# Patient Record
Sex: Female | Born: 1945 | Race: Black or African American | Hispanic: No | Marital: Single | Smoking: Never smoker
Health system: Southern US, Community
[De-identification: ages and names within clinical notes are randomized; demographics above are authoritative.]

## PROBLEM LIST (undated history)

## (undated) DIAGNOSIS — Z803 Family history of malignant neoplasm of breast: Secondary | ICD-10-CM

## (undated) DIAGNOSIS — G709 Myoneural disorder, unspecified: Secondary | ICD-10-CM

## (undated) DIAGNOSIS — Z8052 Family history of malignant neoplasm of bladder: Secondary | ICD-10-CM

## (undated) DIAGNOSIS — E119 Type 2 diabetes mellitus without complications: Secondary | ICD-10-CM

## (undated) DIAGNOSIS — Z9221 Personal history of antineoplastic chemotherapy: Secondary | ICD-10-CM

## (undated) DIAGNOSIS — Z923 Personal history of irradiation: Secondary | ICD-10-CM

## (undated) DIAGNOSIS — C50919 Malignant neoplasm of unspecified site of unspecified female breast: Secondary | ICD-10-CM

## (undated) DIAGNOSIS — Z853 Personal history of malignant neoplasm of breast: Secondary | ICD-10-CM

## (undated) DIAGNOSIS — N189 Chronic kidney disease, unspecified: Secondary | ICD-10-CM

## (undated) DIAGNOSIS — D649 Anemia, unspecified: Secondary | ICD-10-CM

## (undated) DIAGNOSIS — I10 Essential (primary) hypertension: Secondary | ICD-10-CM

## (undated) HISTORY — DX: Myoneural disorder, unspecified: G70.9

## (undated) HISTORY — DX: Personal history of malignant neoplasm of breast: Z85.3

## (undated) HISTORY — DX: Anemia, unspecified: D64.9

## (undated) HISTORY — DX: Family history of malignant neoplasm of breast: Z80.3

## (undated) HISTORY — DX: Malignant neoplasm of unspecified site of unspecified female breast: C50.919

## (undated) HISTORY — DX: Family history of malignant neoplasm of bladder: Z80.52

## (undated) HISTORY — PX: ABDOMINAL HYSTERECTOMY: SHX81

## (undated) HISTORY — DX: Chronic kidney disease, unspecified: N18.9

## (undated) HISTORY — DX: Essential (primary) hypertension: I10

## (undated) HISTORY — DX: Type 2 diabetes mellitus without complications: E11.9

---

## 2004-06-23 HISTORY — PX: BREAST LUMPECTOMY: SHX2

## 2018-08-23 HISTORY — PX: BREAST LUMPECTOMY: SHX2

## 2020-09-26 DIAGNOSIS — Z8739 Personal history of other diseases of the musculoskeletal system and connective tissue: Secondary | ICD-10-CM | POA: Insufficient documentation

## 2020-10-07 ENCOUNTER — Ambulatory Visit: Payer: Medicare HMO | Admitting: Podiatry

## 2020-10-07 ENCOUNTER — Other Ambulatory Visit: Payer: Self-pay

## 2020-10-07 DIAGNOSIS — E119 Type 2 diabetes mellitus without complications: Secondary | ICD-10-CM

## 2020-10-07 DIAGNOSIS — M79675 Pain in left toe(s): Secondary | ICD-10-CM

## 2020-10-07 DIAGNOSIS — R251 Tremor, unspecified: Secondary | ICD-10-CM | POA: Insufficient documentation

## 2020-10-07 DIAGNOSIS — B351 Tinea unguium: Secondary | ICD-10-CM

## 2020-10-07 DIAGNOSIS — F32A Depression, unspecified: Secondary | ICD-10-CM | POA: Insufficient documentation

## 2020-10-07 DIAGNOSIS — I10 Essential (primary) hypertension: Secondary | ICD-10-CM | POA: Insufficient documentation

## 2020-10-07 DIAGNOSIS — M79674 Pain in right toe(s): Secondary | ICD-10-CM | POA: Diagnosis not present

## 2020-10-07 DIAGNOSIS — E7800 Pure hypercholesterolemia, unspecified: Secondary | ICD-10-CM | POA: Insufficient documentation

## 2020-10-07 DIAGNOSIS — Z170 Estrogen receptor positive status [ER+]: Secondary | ICD-10-CM | POA: Insufficient documentation

## 2020-10-07 DIAGNOSIS — C50911 Malignant neoplasm of unspecified site of right female breast: Secondary | ICD-10-CM | POA: Insufficient documentation

## 2020-10-07 DIAGNOSIS — C50919 Malignant neoplasm of unspecified site of unspecified female breast: Secondary | ICD-10-CM | POA: Insufficient documentation

## 2020-10-10 ENCOUNTER — Telehealth: Payer: Self-pay | Admitting: Nurse Practitioner

## 2020-10-10 NOTE — Telephone Encounter (Signed)
Received a new pt referral from Toni Stone for hx of breast cancer. Toni Stone returned my call and has been scheduled to see Toni Stone on 5/5 at 11am. Letter mailed.

## 2020-10-11 ENCOUNTER — Encounter: Payer: Self-pay | Admitting: Podiatry

## 2020-10-11 NOTE — Progress Notes (Signed)
  Subjective:  Patient ID: Ladawn Boullion, female    DOB: 06-30-1945,  MRN: [id number]  Chief Complaint  Patient presents with  . Diabetes    Diabetic foot exam Nail trim Last A1C 7.8 last week BS 117 this morning     75 y.o. female presents with the above complaint. History confirmed with patient.   Objective:  Physical Exam: warm, good capillary refill, no trophic changes or ulcerative lesions, normal DP and PT pulses, normal monofilament exam and normal sensory exam. Onychomycosis x10  Assessment:   1. Type 2 diabetes mellitus without complication, without long-term current use of insulin (HCC)   2. Pain due to onychomycosis of toenails of both feet      Plan:  Patient was evaluated and treated and all questions answered.  Discussed the etiology and treatment options for the condition in detail with the patient. Educated patient on the topical and oral treatment options for mycotic nails. Recommended debridement of the nails today. Sharp and mechanical debridement performed of all painful and mycotic nails today. Nails debrided in length and thickness using a nail nipper to level of comfort. Discussed treatment options including appropriate shoe gear. Follow up as needed for painful nails.  Patient educated on diabetes. Discussed proper diabetic foot care and discussed risks and complications of disease. Educated patient in depth on reasons to return to the office immediately should he/she discover anything concerning or new on the feet. All questions answered. Discussed proper shoes as well.    Return in about 3 months (around 01/06/2021) for at risk diabetic foot care.

## 2020-10-23 ENCOUNTER — Inpatient Hospital Stay: Payer: Medicare HMO | Attending: Nurse Practitioner | Admitting: Nurse Practitioner

## 2020-10-23 ENCOUNTER — Telehealth: Payer: Self-pay | Admitting: Nurse Practitioner

## 2020-10-23 ENCOUNTER — Encounter: Payer: Self-pay | Admitting: Nurse Practitioner

## 2020-10-23 ENCOUNTER — Other Ambulatory Visit: Payer: Self-pay

## 2020-10-23 ENCOUNTER — Other Ambulatory Visit: Payer: Self-pay | Admitting: Licensed Clinical Social Worker

## 2020-10-23 VITALS — BP 166/84 | HR 71 | Temp 96.2°F | Resp 17 | Wt 263.4 lb

## 2020-10-23 DIAGNOSIS — Z79811 Long term (current) use of aromatase inhibitors: Secondary | ICD-10-CM | POA: Diagnosis not present

## 2020-10-23 DIAGNOSIS — Z853 Personal history of malignant neoplasm of breast: Secondary | ICD-10-CM | POA: Insufficient documentation

## 2020-10-23 DIAGNOSIS — C50912 Malignant neoplasm of unspecified site of left female breast: Secondary | ICD-10-CM | POA: Diagnosis not present

## 2020-10-23 DIAGNOSIS — Z171 Estrogen receptor negative status [ER-]: Secondary | ICD-10-CM | POA: Diagnosis not present

## 2020-10-23 DIAGNOSIS — M25561 Pain in right knee: Secondary | ICD-10-CM | POA: Diagnosis not present

## 2020-10-23 DIAGNOSIS — Z170 Estrogen receptor positive status [ER+]: Secondary | ICD-10-CM | POA: Insufficient documentation

## 2020-10-23 DIAGNOSIS — N189 Chronic kidney disease, unspecified: Secondary | ICD-10-CM | POA: Diagnosis not present

## 2020-10-23 DIAGNOSIS — Z803 Family history of malignant neoplasm of breast: Secondary | ICD-10-CM | POA: Insufficient documentation

## 2020-10-23 DIAGNOSIS — Z8052 Family history of malignant neoplasm of bladder: Secondary | ICD-10-CM | POA: Insufficient documentation

## 2020-10-23 DIAGNOSIS — E119 Type 2 diabetes mellitus without complications: Secondary | ICD-10-CM | POA: Insufficient documentation

## 2020-10-23 DIAGNOSIS — I129 Hypertensive chronic kidney disease with stage 1 through stage 4 chronic kidney disease, or unspecified chronic kidney disease: Secondary | ICD-10-CM | POA: Diagnosis not present

## 2020-10-23 DIAGNOSIS — C50911 Malignant neoplasm of unspecified site of right female breast: Secondary | ICD-10-CM

## 2020-10-23 DIAGNOSIS — D631 Anemia in chronic kidney disease: Secondary | ICD-10-CM | POA: Insufficient documentation

## 2020-10-23 DIAGNOSIS — C50012 Malignant neoplasm of nipple and areola, left female breast: Secondary | ICD-10-CM

## 2020-10-23 NOTE — Progress Notes (Addendum)
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 Initially diagnosed with right breast cancer in 06/23/2004 stage IA, pT1c pN0 M0, ER+/PR+, HER2 negative, grade 2,  s/p lumpectomy and ALND, adjuvant chemo, radiation, and AI with Anastrozole x5 years. She had regular mammograms after that until she self palpated a left breast mass and was ultimately diagnosed in 08/23/2018 with left breast cancer, s/p lumpectomy and SLNB showing invasive ductal carcinoma measuring 3.1 cm, clear margins, 6 sentinel lymph nodes all negative, grade 3, ER 0%, PR 0%, HER2 0% triple negative with Ki-67 of 90%, pT2 N0 MX, stage IIA.  Staging PET 09/2018 was negative for metastatic disease. S/p lumpectomy, adjuvant chemo (likely AC-T), and radiation completed 05/04/2019. She tolerated chemo with nausea, body aches, neuropathy, and fatigue. Port was removed on 05/07/2019. She was due for mammogram in 06/2020 but moved from [place] to [facility] and had not established with PCP yet.   Socially, she lives with her daughter, Toni Stone. She is mostly sedentary but independent. Ambulates with cane.  Denies history of current use of alcohol, tobacco, or other drugs.  Reportedly last colonoscopy in 2014 showed hemorrhoids, and EGD in 2015 showed PUD.  Family history is significant for a biological sister who had breast and bladder cancers, and mother had "gall duct" cancer (cholangio versus gallbladder?).  Patient has not had genetic testing.   GYN HISTORY  Menarchal: Age 42 LMP: Age 54s with complete hysterectomy due to  ectopic pregnancy HRT: None G 2 P 1:  Today she presents with her daughter, feels well in general.  Appetite is normal without unintentional weight loss.  She has fatigue at baseline.  She has lymphedema in the left breast, denies new lump/mass, nipple discharge or inversion, or skin change.  Chronic right knee pain otherwise no new bone or joint pain.  Denies change in bowel habits, GI/GYN bleeding, recent fever or infection, or headaches.  She has occasional vision change.  She is vaccinated and boosted against COVID-19.  MEDICAL HISTORY:  Past Medical History:  Diagnosis Date  . Anemia   . Breast cancer ([place])   . Chronic kidney disease   . Diabetes mellitus without complication ([place])   . Hypertension   . Neuromuscular disorder [facility])    Tremor    SURGICAL HISTORY: Past Surgical History:  Procedure Laterality Date  . ABDOMINAL HYSTERECTOMY     Age 23s, for ectopic pregnancy  . BREAST LUMPECTOMY Right 06/23/2004   pT1cN0M0 ER/PR + HER2 - stage IA  . BREAST LUMPECTOMY Left 08/23/2018   pT2N0M0 ER/PR/HER2 neg stage IIA    SOCIAL HISTORY: Social History   Socioeconomic History  . Marital status: Single    Spouse name: Not on file  . Number of children: 1  . Years of education: Not on file  . Highest education level: Not on file  Occupational History  . Occupation: Retired Therapist, sports    Comment: Psych  Tobacco Use  . Smoking status: Never Smoker  . Smokeless tobacco: Not on file  Substance and Sexual Activity  . Alcohol use: Not Currently  . Drug use: Never  .  Sexual activity: Not Currently  Other Topics Concern  . 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 Cancer Mother        Bile duct versus gallbladder  . Cancer Sister        Breast and bladder    ALLERGIES:  is allergic to metformin.  MEDICATIONS:  Current Outpatient Medications  Medication Sig Dispense Refill  . Blood Glucose Monitoring Suppl (GLUCOCOM BLOOD GLUCOSE MONITOR) DEVI 1 each by Misc.(Non-Drug; Combo Route) route daily. May substitute for insurance    . febuxostat (ULORIC) 40 MG tablet Take 1 tablet by mouth daily.    Marland Kitchen glipiZIDE (GLUCOTROL) 10 MG tablet Take 1 tablet by mouth daily.    Marland Kitchen glucose blood ([employer] BLOOD GLUCOSE TEST) test strip 1 each by Other route daily.    . insulin glargine (LANTUS) 100 UNIT/ML injection Inject into the skin.    . pregabalin (LYRICA) 75 MG capsule Take by mouth.    . primidone (MYSOLINE) 250 MG tablet Take by mouth.    . propranolol (INNOPRAN XL) 120 MG 24 hr capsule Take 1 capsule by mouth daily.    . simvastatin (ZOCOR) 20 MG tablet Take by mouth.    . sitaGLIPtin (JANUVIA) 50 MG tablet Take 1 tablet by mouth daily.    Marland Kitchen triamterene-hydrochlorothiazide (MAXZIDE-25) 37.5-25 MG tablet Take 1 tablet by mouth daily.     No current facility-administered medications for this visit.    REVIEW OF SYSTEMS:   Constitutional: Denies fevers, chills or abnormal night sweats (+) fatigue Eyes: Denies blurriness of vision, double vision or watery eyes (+) intermittent vision change Ears, nose, mouth, throat, and face: Denies mucositis or sore throat Respiratory: Denies cough, dyspnea or wheezes Cardiovascular: Denies palpitation, chest discomfort or lower extremity swelling Gastrointestinal:  Denies nausea, heartburn or change in bowel habits Skin: Denies abnormal skin rashes Lymphatics: Denies new lymphadenopathy or easy bruising (+) left breast lymphedema Neurological:Denies numbness, tingling or new weaknesses or headaches (+) tremor (+) history of  depression Behavioral/Psych: Mood is stable, no new changes  MSK: (+) Chronic right knee pain Breast: (+) History of bilateral breast cancer All other systems were reviewed with the patient and are negative.  PHYSICAL EXAMINATION: ECOG PERFORMANCE STATUS: 0 - Asymptomatic  Vitals:   10/23/20 1101  BP: (!) 166/84  Pulse: 71  Resp: 17  Temp: (!) 96.2 F (35.7 C)  SpO2: 100%   Filed Weights   10/23/20 1101  Weight: 263 lb 6.4 oz (119.5 kg)    GENERAL:alert, no distress and comfortable SKIN: No rash EYES: sclera clear NECK: Without mass LYMPH:  no palpable cervical or supraclavicular lymphadenopathy  LUNGS:  normal breathing effort HEART:  no lower extremity edema Musculoskeletal:no cyanosis of digits and no clubbing  PSYCH: alert & oriented x 3 with fluent speech NEURO: no focal motor/sensory deficits.  Left hand tremor Breast exam: Breasts are asymmetric with no bilateral nipple discharge or inversion.  S/p bilateral lumpectomies, incisions completely healed.  In the right breast, there is 1 cm scar tissue at the  incision in the upper inner quadrant.  Left breast with lymphedema and mild hyperpigmentation.  No palpable masses in either breast or axilla that I could appreciate  LABORATORY DATA:  I have reviewed the data as listed No flowsheet data found.   RADIOGRAPHIC STUDIES: I have personally reviewed the radiological images as listed and agreed with the findings in the report. No results found.  ASSESSMENT & PLAN: 75 yo female with   1.  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  Staging PET scan was negative for distant metastasis.  She completed adjuvant chemo (possibly AC-T) and radiation.  We have requested outside records to determine the exact chemo regimen. Due to triple negative breast  cancer, she would not benefit from antiestrogen therapy -She tolerated chemo with nausea, body aches, neuropathy, and fatigue.  She recovered well except residual fatigue -CA 27-29 normal on 06/05/2020 at last surveillance visit in [place] -She has mild left breast lymphedema, I will refer her back to PT -We reviewed her medical record, path, cancer treatment, and the surveillance plan.  We have referred her for mammogram and baseline DEXA, will call her with results -Ms. Chasen appears stable, doing well, breast exam is benign except left breast lymphedema.  There is no clinical concern for breast cancer recurrence -We reviewed the recurrence risk associated with grade 3 triple negative breast cancers in general.   -patient seen with Dr. Burr Medico. We will continue surveillance visits every 6 months until she reaches 5 years from her initial 2020 diagnosis, then annually  2.  Malignant neoplasm left breast, stage 1A, pT1cN0M0, ER/PR positive, HER2 negative, grade 2 -Diagnosed 06/23/2004, s/p lumpectomy with axillary lymph node dissection, adjuvant chemo (per patient), radiation, and 5 years of antiestrogen therapy with anastrozole -While this cancer was earlier stage, we reviewed the risk of late recurrence with ER positive breast cancers -Continue surveillance  3.  Genetics -Given patient's history of bilateral breast cancer, and family history of sister with breast and bladder cancer, and mother with cancer, she qualifies for genetics -She is interested and agreeable.  I have referred her  4.  Chronic right knee pain -Ambulates with a cane. -Denies any other new/worsening bone or joint pain  5.  DM, CKD, anemia, HTN, HL -Followed by PCP -HG A1c 7.8, BG 172, SCR 1.49, K5.6 on 09/26/2020 -Per outside records from [place], [place] on 06/04/2020 showed Hgb 10.9, HCT 32.4 otherwise normal CBC/differential.  She likely has anemia of chronic disease from CKD and other comorbidities -will obtain baseline labs  and iron studies at next visit    6.  Health maintenance disease prevention -We reviewed staying up-to-date on age-appropriate cancer screenings and vaccines, she received COVID-vaccine and booster -Colonoscopy in 2014 and EGD in 2015 in [place] to be her last, per patient -Reviewed the importance of healthy diet/weight, exercise as tolerated, avoiding smoking and limiting alcohol -Continue follow-up with PCP  PLAN: -Medical record reviewed -Continue breast cancer surveillance -Mammogram and DEXA due this month -Referrals to genetics and PT for left breast LE -Request records from [facility], Dr. Hubbard Robinson for details of previous chemo -Surveillance visit with lab in 6 months   Orders Placed This Encounter  Procedures  . MM DIAG BREAST TOMO BILATERAL    Standing Status:   Future    Standing Expiration Date:   10/23/2021  Order Specific Question:   Reason for Exam (SYMPTOM  OR DIAGNOSIS REQUIRED)    Answer:   h/o R br cancer 2006 L br cancer 2020 s/p lumpectomies and RT    Order Specific Question:   Preferred imaging location?    Answer:   [facility]  . DG Bone Density    Standing Status:   Future    Standing Expiration Date:   10/23/2021    Order Specific Question:   Reason for Exam (SYMPTOM  OR DIAGNOSIS REQUIRED)    Answer:   baseline, hysterectomy in 30's, 5 years AI    Order Specific Question:   Preferred imaging location?    Answer:   [facility]  . CBC with Differential (Cancer Center Only)    Standing Status:   Standing    Number of Occurrences:   50    Standing Expiration Date:   10/23/2021  . CMP ([facility] only)    Standing Status:   Standing    Number of Occurrences:   50    Standing Expiration Date:   10/23/2021  . Iron and TIBC    Standing Status:   Standing    Number of Occurrences:   1    Standing Expiration Date:   10/23/2021  . Ferritin    Standing Status:   Standing    Number of Occurrences:   1    Standing Expiration Date:    10/23/2021  . CA 27.29    Standing Status:   Standing    Number of Occurrences:   20    Standing Expiration Date:   10/23/2021  . Ambulatory referral to Physical Therapy    Referral Priority:   Routine    Referral Type:   Physical Medicine    Referral Reason:   Specialty Services Required    Requested Specialty:   Physical Therapy    Number of Visits Requested:   1  . Ambulatory referral to Genetics    Referral Priority:   Routine    Referral Type:   Consultation    Referral Reason:   Specialty Services Required    Number of Visits Requested:   1    All questions were answered. The patient knows to call the clinic with any problems, questions or concerns.     Alla Feeling, NP 10/23/2020   Addendum I have seen the patient, examined her. I agree with the assessment and and plan and have edited the notes.   75 year old female, with past medical history of stage I right breast cancer, ER positive HER2 negative in 2006,and triple negative stage II left breast cancer in March 2020.  She received standard care for bilateral breast cancer.  I have reviewed her outside records, and confirmed key findings with patient.  She is clinically doing well, no concern for recurrence.  We will continue cancer surveillance.  We discussed symptoms to watch her at home, routine follow up with lab and exam, and continue annual mammogram etc.  She voiced good understanding and agrees with the plan.  Truitt Merle  10/23/2020

## 2020-10-23 NOTE — Telephone Encounter (Signed)
Scheduled follow-up appointments per 5/5 los. Patient is aware. 

## 2020-10-27 ENCOUNTER — Inpatient Hospital Stay (HOSPITAL_BASED_OUTPATIENT_CLINIC_OR_DEPARTMENT_OTHER): Payer: Medicare HMO | Admitting: Licensed Clinical Social Worker

## 2020-10-27 ENCOUNTER — Inpatient Hospital Stay: Payer: Medicare HMO

## 2020-10-27 ENCOUNTER — Encounter: Payer: Self-pay | Admitting: Licensed Clinical Social Worker

## 2020-10-27 ENCOUNTER — Other Ambulatory Visit: Payer: Self-pay

## 2020-10-27 DIAGNOSIS — C50912 Malignant neoplasm of unspecified site of left female breast: Secondary | ICD-10-CM | POA: Diagnosis not present

## 2020-10-27 DIAGNOSIS — C50012 Malignant neoplasm of nipple and areola, left female breast: Secondary | ICD-10-CM

## 2020-10-27 DIAGNOSIS — Z8052 Family history of malignant neoplasm of bladder: Secondary | ICD-10-CM | POA: Diagnosis not present

## 2020-10-27 DIAGNOSIS — C50911 Malignant neoplasm of unspecified site of right female breast: Secondary | ICD-10-CM

## 2020-10-27 DIAGNOSIS — Z803 Family history of malignant neoplasm of breast: Secondary | ICD-10-CM | POA: Diagnosis not present

## 2020-10-27 DIAGNOSIS — Z171 Estrogen receptor negative status [ER-]: Secondary | ICD-10-CM

## 2020-10-27 DIAGNOSIS — Z853 Personal history of malignant neoplasm of breast: Secondary | ICD-10-CM | POA: Insufficient documentation

## 2020-10-27 LAB — CBC WITH DIFFERENTIAL (CANCER CENTER ONLY)
Abs Immature Granulocytes: 0.02 10*3/uL (ref 0.00–0.07)
Basophils Absolute: 0 10*3/uL (ref 0.0–0.1)
Basophils Relative: 1 %
Eosinophils Absolute: 0.2 10*3/uL (ref 0.0–0.5)
Eosinophils Relative: 3 %
HCT: 34.8 % — ABNORMAL LOW (ref 36.0–46.0)
Hemoglobin: 11.1 g/dL — ABNORMAL LOW (ref 12.0–15.0)
Immature Granulocytes: 0 %
Lymphocytes Relative: 20 %
Lymphs Abs: 1 10*3/uL (ref 0.7–4.0)
MCH: 29 pg (ref 26.0–34.0)
MCHC: 31.9 g/dL (ref 30.0–36.0)
MCV: 90.9 fL (ref 80.0–100.0)
Monocytes Absolute: 0.4 10*3/uL (ref 0.1–1.0)
Monocytes Relative: 8 %
Neutro Abs: 3.5 10*3/uL (ref 1.7–7.7)
Neutrophils Relative %: 68 %
Platelet Count: 181 10*3/uL (ref 150–400)
RBC: 3.83 MIL/uL — ABNORMAL LOW (ref 3.87–5.11)
RDW: 14.4 % (ref 11.5–15.5)
WBC Count: 5.1 10*3/uL (ref 4.0–10.5)
nRBC: 0 % (ref 0.0–0.2)

## 2020-10-27 LAB — IRON AND TIBC
Iron: 57 ug/dL (ref 41–142)
Saturation Ratios: 21 % (ref 21–57)
TIBC: 270 ug/dL (ref 236–444)
UIBC: 213 ug/dL (ref 120–384)

## 2020-10-27 LAB — CMP (CANCER CENTER ONLY)
ALT: 38 U/L (ref 0–44)
AST: 29 U/L (ref 15–41)
Albumin: 3.3 g/dL — ABNORMAL LOW (ref 3.5–5.0)
Alkaline Phosphatase: 119 U/L (ref 38–126)
Anion gap: 12 (ref 5–15)
BUN: 42 mg/dL — ABNORMAL HIGH (ref 8–23)
CO2: 30 mmol/L (ref 22–32)
Calcium: 9.3 mg/dL (ref 8.9–10.3)
Chloride: 104 mmol/L (ref 98–111)
Creatinine: 1.54 mg/dL — ABNORMAL HIGH (ref 0.44–1.00)
GFR, Estimated: 36 mL/min — ABNORMAL LOW (ref >60)
Glucose, Bld: 137 mg/dL — ABNORMAL HIGH (ref 70–99)
Potassium: 4.5 mmol/L (ref 3.5–5.1)
Sodium: 146 mmol/L — ABNORMAL HIGH (ref 135–145)
Total Bilirubin: <0.2 mg/dL — ABNORMAL LOW (ref 0.3–1.2)
Total Protein: 7.9 g/dL (ref 6.5–8.1)

## 2020-10-27 LAB — GENETIC SCREENING ORDER

## 2020-10-27 LAB — FERRITIN: Ferritin: 144 ng/mL (ref 11–307)

## 2020-10-27 NOTE — Progress Notes (Signed)
REFERRING PROVIDER: Alla Feeling, NP [place],  [place] [id number]  PRIMARY PROVIDER:  Berkley Harvey, NP  PRIMARY REASON FOR VISIT:  1. Family history of breast cancer   2. Family history of bladder cancer   3. Personal history of breast cancer      HISTORY OF PRESENT ILLNESS:   Ms. Grennan, a 75 y.o. female, was seen for a [facility] cancer genetics consultation at the request of Dr. Kalman Shan due to a personal and family history of cancer.  Ms. Winnie presents to clinic today to discuss the possibility of a hereditary predisposition to cancer, genetic testing, and to further clarify her future cancer risks, as well as potential cancer risks for family members.   In 2006, at the age of 68, Ms. Colonna was diagnosed with right breast cancer, ER/PR+, Her2-. This was treated with lumpectomy, chemotherapy, radiation and anastrozole.  In 2020, at the age of 71, Ms. Willner was diagnosed with left breast cancer, triple negative. This was treated with lumpectomy, chemotherapy and radiation.   CANCER HISTORY:  Oncology History  Malignant neoplasm of right breast in female, estrogen receptor positive ([place])  06/23/2004 Cancer Staging   Staging form: Breast, AJCC 8th Edition - Pathologic stage from 06/23/2004: Stage IA (pT1c, pN0, cM0, G2, ER+, PR+, HER2-) - Signed by Alla Feeling, NP on 10/23/2020 Stage prefix: Initial diagnosis Histologic grading system: 3 grade system   10/07/2020 Initial Diagnosis   Malignant neoplasm of right breast in female, estrogen receptor positive ([place])   Malignant neoplasm of left breast in female, estrogen receptor negative ([place])  08/23/2018 Cancer Staging   Staging form: Breast, AJCC 8th Edition - Pathologic stage from 08/23/2018: Stage IIA (pT2, pN0, cM0, G3, ER-, PR-, HER2-) - Signed by Alla Feeling, NP on 10/23/2020 Stage prefix: Initial diagnosis Histologic grading system: 3 grade system   10/23/2020 Initial Diagnosis   Malignant neoplasm of left  breast in female, estrogen receptor negative ([place])      RISK FACTORS:  Menarche was at age 32.  First live birth at age 47.  OCP use for approximately 0 years.  Ovaries intact: no.  Hysterectomy: yes.  Menopausal status: postmenopausal.  HRT use: 0 years. Colonoscopy: yes; normal. Mammogram within the last year: yes. Number of breast biopsies: 2.  Past Medical History:  Diagnosis Date  . Anemia   . Breast cancer ([place])   . Chronic kidney disease   . Diabetes mellitus without complication ([place])   . Family history of bladder cancer   . Family history of breast cancer   . Hypertension   . Neuromuscular disorder ([facility])    Tremor  . Personal history of breast cancer     Past Surgical History:  Procedure Laterality Date  . ABDOMINAL HYSTERECTOMY     Age 40s, for ectopic pregnancy  . BREAST LUMPECTOMY Right 06/23/2004   pT1cN0M0 ER/PR + HER2 - stage IA  . BREAST LUMPECTOMY Left 08/23/2018   pT2N0M0 ER/PR/HER2 neg stage IIA    Social History   Socioeconomic History  . Marital status: Single    Spouse name: Not on file  . Number of children: 1  . Years of education: Not on file  . Highest education level: Not on file  Occupational History  . Occupation: Retired Therapist, sports    Comment: Psych  Tobacco Use  . Smoking status: Never Smoker  . Smokeless tobacco: Not on file  Substance and Sexual Activity  . Alcohol use: Not Currently  .  Drug use: Never  . Sexual activity: Not Currently  Other Topics Concern  . 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  Significant diagnoses are listed below: Family History   Problem Relation Age of Onset  . Cancer Mother        Bile duct versus gallbladder  . Cancer Sister        Breast and bladder   Ms. Pfohl has 1 daughter, age 55, with no history of cancer. Patient had 2 full sisters, one passed when patient was young and the other had breast cancer and bladder cancer diagnosed in her 51s and passed at 20. Patient has 1 niece through this sister. Patient also has 3 paternal half siblings, no cancers.   Ms. Vahey mother had gallbladder cancer at 24 and died at 27. Patient had 3 maternal uncles, 1 aunt, no cancers. No known cancers in maternal cousins or grandparents.   Ms. Gal father died in his 53s. No known cancers on this side of the family.  Ms. Alf is unaware of previous family history of genetic testing for hereditary cancer risks. Patient's maternal ancestors are of Black/white descent, and paternal ancestors are of Black descent. There is no reported Ashkenazi Jewish ancestry. There is no known consanguinity.    GENETIC COUNSELING ASSESSMENT: Ms. Torti is a 75 y.o. female with a personal and family history of breast cancer which is somewhat suggestive of a hereditary cancer syndrome and predisposition to cancer. We, therefore, discussed and recommended the following at today's visit.   DISCUSSION: We discussed that approximately 5-10% of breast cancer is hereditary  Most cases of hereditary breast cancer are associated with BRCA1/BRCA2 genes, although there are other genes associated with hereditary breast cancer as well as other types of cancer as well. Cancers and risks are gene specific.  We discussed that testing is beneficial for several reasons including  knowing about other cancer risks, identifying potential screening and risk-reduction options that may be appropriate, and to understand if other family members could be at risk for cancer and allow them to undergo genetic testing.   We reviewed the characteristics, features and  inheritance patterns of hereditary cancer syndromes. We also discussed genetic testing, including the appropriate family members to test, the process of testing, insurance coverage and turn-around-time for results. We discussed the implications of a negative, positive and/or variant of uncertain significant result. We recommended Ms. Florene Glen pursue genetic testing for the [facility] Multi-Cancer Panel+RNA gene panel.   The Multi-Cancer Panel + RNA offered by [facility] includes sequencing and/or deletion duplication testing of the following 84 genes: AIP, ALK, APC, ATM, AXIN2,BAP1,  BARD1, BLM, BMPR1A, BRCA1, BRCA2, BRIP1, CASR, CDC73, CDH1, CDK4, CDKN1B, CDKN1C, CDKN2A (p14ARF), CDKN2A (p16INK4a), CEBPA, CHEK2, CTNNA1, DICER1, DIS3L2, EGFR (c.2369C>T, p.Thr790Met variant only), EPCAM (Deletion/duplication testing only), FH, FLCN, GATA2, GPC3, GREM1 (Promoter region deletion/duplication testing only), HOXB13 (c.251G>A, p.Gly84Glu), HRAS, KIT, MAX, MEN1, MET, MITF (c.952G>A, p.Glu318Lys variant only), MLH1, MSH2, MSH3, MSH6, MUTYH, NBN, NF1, NF2, NTHL1, PALB2, PDGFRA, PHOX2B, PMS2, POLD1, POLE, POT1, PRKAR1A, PTCH1, PTEN, RAD50, RAD51C, RAD51D, RB1, RECQL4, RET, RUNX1, SDHAF2, SDHA (  sequence changes only), SDHB, SDHC, SDHD, SMAD4, SMARCA4, SMARCB1, SMARCE1, STK11, SUFU, TERC, TERT, TMEM127, TP53, TSC1, TSC2, VHL, WRN and WT1.  Based on Ms. Birky's personal and family history of cancer, she meets medical criteria for genetic testing. Despite that she meets criteria, she may still have an out of pocket cost. We discussed that if her out of pocket cost for testing is over $100, the laboratory will call and confirm whether she wants to proceed with testing.  If the out of pocket cost of testing is less than $100 she will be billed by the genetic testing laboratory.   PLAN: After considering the risks, benefits, and limitations, Ms. Florene Glen provided informed consent to pursue genetic testing and the blood sample was sent  to [facility] for analysis of the Multi-Cancer Panel+RNA. Results should be available within approximately 2-3 weeks' time, at which point they will be disclosed by telephone to Ms. Recht, as will any additional recommendations warranted by these results. Ms. Dilmore will receive a summary of her genetic counseling visit and a copy of her results once available. This information will also be available in Epic.   Ms. Zogg questions were answered to her satisfaction today. Our contact information was provided should additional questions or concerns arise. Thank you for the referral and allowing [place] to share in the care of your patient.   Faith Rogue, MS, [facility] Genetic Counselor [email or web address] Phone: [phone]  The patient was seen for a total of 25 minutes in face-to-face genetic counseling.  Dr. Grayland Ormond was available for discussion regarding this case.   _______________________________________________________________________ For Office Staff:  Number of people involved in session: 1 Was an Intern/ student involved with case: no

## 2020-10-28 ENCOUNTER — Encounter: Payer: Self-pay | Admitting: Nurse Practitioner

## 2020-10-28 ENCOUNTER — Ambulatory Visit: Payer: Medicare HMO | Admitting: Rehabilitation

## 2020-10-28 LAB — CANCER ANTIGEN 27.29: CA 27.29: 18.8 U/mL (ref 0.0–38.6)

## 2020-10-28 NOTE — Progress Notes (Signed)
According to additional records received from Dr. 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 There are no records to confirm pt's report that she received adjuvant chemo for stage IA right breast cancer in 2006.   Records reviewed on 10/28/2020 by Cira Rue, NP

## 2020-10-29 ENCOUNTER — Telehealth: Payer: Self-pay

## 2020-10-29 NOTE — Telephone Encounter (Signed)
This nurse spoke with patient and informed her of lab results and advised that we will continue to monitor per NP orders. Patient stated she is using Toni Abrahams, NP as her primary care.   No further questions or concerns at this time.

## 2020-10-29 NOTE — Telephone Encounter (Signed)
-----   Message from Alla Feeling, NP sent at 10/28/2020  9:27 AM EDT ----- Please let her know labs. She has mild anemia, elevated BG, and renal dysfunction. Iron studies and breast cancer tumor marker are normal. Her mild anemia is likely from CKD. We will monitor, and be sure she has PCP, if not we can refer.   Thanks, Regan Rakers, NP

## 2020-11-12 ENCOUNTER — Encounter: Payer: Self-pay | Admitting: Physical Therapy

## 2020-11-12 ENCOUNTER — Ambulatory Visit: Payer: Medicare HMO | Attending: Nurse Practitioner | Admitting: Physical Therapy

## 2020-11-12 ENCOUNTER — Other Ambulatory Visit: Payer: Self-pay

## 2020-11-12 DIAGNOSIS — M25612 Stiffness of left shoulder, not elsewhere classified: Secondary | ICD-10-CM | POA: Diagnosis present

## 2020-11-12 DIAGNOSIS — M25611 Stiffness of right shoulder, not elsewhere classified: Secondary | ICD-10-CM | POA: Diagnosis present

## 2020-11-12 DIAGNOSIS — R262 Difficulty in walking, not elsewhere classified: Secondary | ICD-10-CM | POA: Diagnosis present

## 2020-11-12 DIAGNOSIS — M6281 Muscle weakness (generalized): Secondary | ICD-10-CM | POA: Insufficient documentation

## 2020-11-12 DIAGNOSIS — I890 Lymphedema, not elsewhere classified: Secondary | ICD-10-CM | POA: Diagnosis not present

## 2020-11-12 NOTE — Therapy (Signed)
[place], [place], [address] Phone: [phone]   Fax:  [phone]  Physical Therapy Evaluation  Patient Details  Name: Toni Stone MRN: [id number] Date of Birth: October 10, 1945 Referring Provider (PT): Cira Rue   Encounter Date: 11/12/2020   PT End of Session - 11/12/20 1626    Visit Number 1    Number of Visits 9    Date for PT Re-Evaluation 12/15/20    PT Start Time 1500    PT Stop Time 1545    PT Time Calculation (min) 45 min    Activity Tolerance Patient tolerated treatment well    Behavior During Therapy [facility] for tasks assessed/performed           Past Medical History:  Diagnosis Date  . Anemia   . Breast cancer ([place])   . Chronic kidney disease   . Diabetes mellitus without complication ([place])   . Family history of bladder cancer   . Family history of breast cancer   . Hypertension   . Neuromuscular disorder (HCC)    Tremor  . Personal history of breast cancer     Past Surgical History:  Procedure Laterality Date  . ABDOMINAL HYSTERECTOMY     Age 68s, for ectopic pregnancy  . BREAST LUMPECTOMY Right 06/23/2004   pT1cN0M0 ER/PR + HER2 - stage IA  . BREAST LUMPECTOMY Left 08/23/2018   pT2N0M0 ER/PR/HER2 neg stage IIA    There were no vitals filed for this visit.    Subjective Assessment - 11/12/20 1504    Subjective Pt is here for treamtent of left breast lymphedema. She wears a compression bra every day.    Pertinent History right breast cancer in 2005-08-24 with chemo, lumpectomy and radiation and arimedex . left breast cancer 06/2018 lumpectomy with post op swelling that opened up and drained , chemo and radiation,  Pt was treated for this in [place] . She moved to [facility] in Nov. 2021 Past history includes tremors of her hands left > right, IDDM, HTN, sometimes her right knee will "go out " her so she uses a quad cane    Currently in Pain? No/denies              [facility]  PT Assessment - 11/12/20 0001      Assessment   Medical Diagnosis bilateral breast cancer    Referring Provider (PT) Cira Rue    Onset Date/Surgical Date 06/21/18    Hand Dominance Left      Precautions   Precautions Fall      Restrictions   Weight Bearing Restrictions No      Balance Screen   Has the patient fallen in the past 6 months No    Has the patient had a decrease in activity level because of a fear of falling?  No    Is the patient reluctant to leave their home because of a fear of falling?  No      Home Environment   Living Environment Private residence    [place]   lived with daughter and her 2 sons , husband died in 08-24-2017   Available Help at Discharge Family      Prior Function   Level of Independence Independent with basic ADLs      Cognition   Overall Cognitive Status Within Functional Limits for tasks assessed      Observation/Other Assessments   Observations pt with visible lymphedema in left breast and lateral chest  Coordination   Gross Motor Movements are Fluid and Coordinated No   pt moves slowly and with difficulty     Posture/Postural Control   Posture/Postural Control Postural limitations    Postural Limitations Rounded Shoulders;Forward head    Posture Comments Pt obese      ROM / Strength   AROM / PROM / Strength AROM;Strength      AROM   Overall AROM  Deficits    AROM Assessment Site Shoulder    Right/Left Shoulder Right;Left    Right Shoulder Flexion 140 Degrees    Right Shoulder ABduction 160 Degrees    Left Shoulder Flexion 160 Degrees    Left Shoulder ABduction 145 Degrees      Strength   Overall Strength Deficits    Overall Strength Comments intentional tremor especially with left arm    Strength Assessment Site Shoulder    Right/Left Shoulder Right;Left    Right Shoulder Flexion 3-/5    Right Shoulder ABduction 3-/5    Left Shoulder Flexion 3-/5    Left Shoulder ABduction 3-/5      Palpation    Palpation comment pt with soft lymphostatic fibrosis in left breast and lateral chest with compression marks from bra on medial breast             LYMPHEDEMA/ONCOLOGY QUESTIONNAIRE - 11/12/20 0001      Type   Cancer Type bilateral breast cancer      Surgeries   Lumpectomy Date 06/21/18   right lumpectomy with  5 nodes removed in 2007   Number Lymph Nodes Removed 5      Treatment   Past Chemotherapy Treatment Yes    Date --   2007 and 2020   Past Radiation Treatment Yes    Date --   2007 and 2020   Body Site right and left breast    Past Hormone Therapy Yes      What other symptoms do you have   Are you Having Heaviness or Tightness Yes    Are you having Pain No    Are you having pitting edema Yes    Body Site left breast    Is it Hard or Difficult finding clothes that fit Yes      Right Upper Extremity Lymphedema   10 cm Proximal to Olecranon Process 40.8 cm   in skin fold   Olecranon Process 28.5 cm    15 cm Proximal to Ulnar Styloid Process 28 cm    Just Proximal to Ulnar Styloid Process 18.5 cm    Across Hand at [employer] 20.5 cm    At [place] of 2nd Digit 6.6 cm      Left Upper Extremity Lymphedema   10 cm Proximal to Olecranon Process 40.5 cm    Olecranon Process 29.5 cm    15 cm Proximal to Ulnar Styloid Process 27 cm    Just Proximal to Ulnar Styloid Process 18.5 cm    Across Hand at [employer] 20 cm    At [place] of 2nd Digit 6.5 cm                       Objective measurements completed on examination: See above findings.       [place] Adult PT Treatment/Exercise - 11/12/20 0001      Self-Care   Self-Care Other Self-Care Comments    Other Self-Care Comments  pt given information about how to get more compression bras and a  script                       PT Long Term Goals - 11/12/20 1909      PT LONG TERM GOAL #1   Title Pt will have new compression bras and demonstrate ability to perform self MLD and use of  compression to help manage her left breast lymphedema    Time 4    Period Weeks    Status New      PT LONG TERM GOAL #2   Title Pt will be independent in a home exercise program for shoulder ROM and strength as remedial exercise for lymphedema    Time 4    Period Weeks    Status New      PT LONG TERM GOAL #3   Title Pt will have a decrease in 2 cm when measured around widest part of chest and back    Time 4    Period Weeks    Status New                  Plan - 11/12/20 1629    Clinical Impression Statement Pt comes to PT with long standing lymphedema of left breast that she had received treatment for when she lived in [place]. She has been wearing a compression bra daily  but is in need of replacement bras.  She was given information to get them today. She would benefit from Complete Decongestive Therapy including manual lymph drainage to left breast and trunk and exericse to arms.  Pt also has weakness and mobility issues and would benefit from PT to help in these areas once lymphedema issues are stabalized. She will need further evaluation with establishment of plan of care for that in the future if she wants to pursue exercise that she will continue at home    Personal Factors and Comorbidities Comorbidity 3+    Comorbidities bilateral breast cancer and treatment, DM, HTN hyperlipidemia, obesity, knee arthritis, bilateral hand tremors    Examination-Activity Limitations Reach Overhead;Caring for Others;Hygiene/Grooming;Locomotion Level    Examination-Participation Restrictions Community Activity;Driving;Medication Management;Meal Prep;Cleaning;Laundry;Volunteer;Yard Work;Shop   pt reports her family has to give her insulin   Stability/Clinical Decision Making Stable/Uncomplicated    Clinical Decision Making Low    Rehab Potential Good    PT Frequency 2x / week    PT Duration 4 weeks    PT Treatment/Interventions Therapeutic activities;Therapeutic exercise;Patient/family  education;Manual lymph drainage;Compression bandaging;Scar mobilization    PT Next Visit Plan Measure around largest part of breast and document for goals. Perform and teach MLD, see if pt got her compression bras. teach shoulder ROM and progress exercise program. ???Flexitouch??? Later, once lymphedema goals are met, re-eval for strength and mobility with new order and cert if pt wants to complete PT to improve mobility    Consulted and Agree with Plan of Care Patient           Patient will benefit from skilled therapeutic intervention in order to improve the following deficits and impairments:  Increased edema,Decreased strength,Decreased mobility,Decreased skin integrity,Impaired UE functional use,Decreased range of motion  Visit Diagnosis: Lymphedema, not elsewhere classified - Plan: PT plan of care cert/re-cert  Stiffness of left shoulder, not elsewhere classified - Plan: PT plan of care cert/re-cert  Stiffness of right shoulder, not elsewhere classified - Plan: PT plan of care cert/re-cert  Muscle weakness (generalized) - Plan: PT plan of care cert/re-cert  Difficulty in walking, not elsewhere classified -  Plan: PT plan of care cert/re-cert     Problem List Patient Active Problem List   Diagnosis Date Noted  . Family history of breast cancer   . Family history of bladder cancer   . Personal history of breast cancer   . Malignant neoplasm of left breast in female, estrogen receptor negative ([place]) 10/23/2020  . Malignant neoplasm of right breast in female, estrogen receptor positive ([place]) 10/07/2020  . Depression 10/07/2020  . Diabetes mellitus ([place]) 10/07/2020  . Hypercholesterolemia 10/07/2020  . Hypertension 10/07/2020  . Tremor of both hands 10/07/2020  . History of gout 09/26/2020   Donato Heinz. Owens Shark PT  [place] 11/12/2020, [place], [place], [address] Phone:  [phone]   Fax:  [phone]  Name: Chalisa Kobler MRN: [id number] Date of Birth: [birth year]

## 2020-11-12 NOTE — Patient Instructions (Signed)
First of all, check with your insurance company to see if provider is in [place] (for wigs and compression sleeves / gloves/gauntlets )  [place], [place] [address] [phone]  Will file some insurances --- call for appointment   Second to [facility] (for mastectomy prosthetics and garments) [place], [place] [address] [phone] Will file some insurances --- call for appointment  [facility]  [address]  [place], [place] [address] [phone] Lower extremity garments  [facility] [place], [place]  [address] [place] [facility] (for compression garments, especilly for lower extremities) [address] [place], [place]  [address] [phone] Call for appointment    Jerrol [employer] ,certified fitter [place]  [phone]  [employer] (for mastectomy supplies and garments) [place] [place],  [address] [phone]  Other Resources: National Lymphedema Network:  www.lymphnet.org www.Klosetraining.com for patient articles and self manual lymph drainage information www.lymphedemablog.com has informative articles.  [email or web address] [email or web address].com [email or web address]

## 2020-11-13 ENCOUNTER — Telehealth: Payer: Self-pay | Admitting: Licensed Clinical Social Worker

## 2020-11-13 ENCOUNTER — Encounter: Payer: Self-pay | Admitting: Licensed Clinical Social Worker

## 2020-11-13 ENCOUNTER — Ambulatory Visit: Payer: Self-pay | Admitting: Licensed Clinical Social Worker

## 2020-11-13 DIAGNOSIS — Z803 Family history of malignant neoplasm of breast: Secondary | ICD-10-CM

## 2020-11-13 DIAGNOSIS — C50912 Malignant neoplasm of unspecified site of left female breast: Secondary | ICD-10-CM

## 2020-11-13 DIAGNOSIS — C50911 Malignant neoplasm of unspecified site of right female breast: Secondary | ICD-10-CM

## 2020-11-13 DIAGNOSIS — Z1379 Encounter for other screening for genetic and chromosomal anomalies: Secondary | ICD-10-CM | POA: Insufficient documentation

## 2020-11-13 DIAGNOSIS — Z853 Personal history of malignant neoplasm of breast: Secondary | ICD-10-CM

## 2020-11-13 DIAGNOSIS — Z8052 Family history of malignant neoplasm of bladder: Secondary | ICD-10-CM

## 2020-11-13 NOTE — Progress Notes (Signed)
HPI:  Ms. Huettner was previously seen in the [facility] clinic due to a personal and family history of cancer and concerns regarding a hereditary predisposition to cancer. Please refer to our prior cancer genetics clinic note for more information regarding our discussion, assessment and recommendations, at the time. Ms. Deane recent genetic test results were disclosed to her, as were recommendations warranted by these results. These results and recommendations are discussed in more detail below.  CANCER HISTORY:  Oncology History  Malignant neoplasm of right breast in female, estrogen receptor positive ([place])  06/23/2004 Cancer Staging   Staging form: Breast, AJCC 8th Edition - Pathologic stage from 06/23/2004: Stage IA (pT1c, pN0, cM0, G2, ER+, PR+, HER2-) - Signed by Alla Feeling, NP on 10/23/2020 Stage prefix: Initial diagnosis Histologic grading system: 3 grade system   10/07/2020 Initial Diagnosis   Malignant neoplasm of right breast in female, estrogen receptor positive ([place])    Genetic Testing   Negative genetic testing. No pathogenic variants identified on the [facility] Multi-Cancer Panel+RNA. The report date is 11/12/2020.  The Multi-Cancer Panel + RNA offered by [facility] includes sequencing and/or deletion duplication testing of the following 84 genes: AIP, ALK, APC, ATM, AXIN2,BAP1,  BARD1, BLM, BMPR1A, BRCA1, BRCA2, BRIP1, CASR, CDC73, CDH1, CDK4, CDKN1B, CDKN1C, CDKN2A (p14ARF), CDKN2A (p16INK4a), CEBPA, CHEK2, CTNNA1, DICER1, DIS3L2, EGFR (c.2369C>T, p.Thr790Met variant only), EPCAM (Deletion/duplication testing only), FH, FLCN, GATA2, GPC3, GREM1 (Promoter region deletion/duplication testing only), HOXB13 (c.251G>A, p.Gly84Glu), HRAS, KIT, MAX, MEN1, MET, MITF (c.952G>A, p.Glu318Lys variant only), MLH1, MSH2, MSH3, MSH6, MUTYH, NBN, NF1, NF2, NTHL1, PALB2, PDGFRA, PHOX2B, PMS2, POLD1, POLE, POT1, PRKAR1A, PTCH1, PTEN, RAD50, RAD51C, RAD51D, RB1, RECQL4, RET, RUNX1, SDHAF2,  SDHA (sequence changes only), SDHB, SDHC, SDHD, SMAD4, SMARCA4, SMARCB1, SMARCE1, STK11, SUFU, TERC, TERT, TMEM127, TP53, TSC1, TSC2, VHL, WRN and WT1.   Malignant neoplasm of left breast in female, estrogen receptor negative ([place])  08/23/2018 Cancer Staging   Staging form: Breast, AJCC 8th Edition - Pathologic stage from 08/23/2018: Stage IIA (pT2, pN0, cM0, G3, ER-, PR-, HER2-) - Signed by Alla Feeling, NP on 10/23/2020 Stage prefix: Initial diagnosis Histologic grading system: 3 grade system   10/23/2020 Initial Diagnosis   Malignant neoplasm of left breast in female, estrogen receptor negative ([place])    Genetic Testing   Negative genetic testing. No pathogenic variants identified on the [facility] Multi-Cancer Panel+RNA. The report date is 11/12/2020.  The Multi-Cancer Panel + RNA offered by [facility] includes sequencing and/or deletion duplication testing of the following 84 genes: AIP, ALK, APC, ATM, AXIN2,BAP1,  BARD1, BLM, BMPR1A, BRCA1, BRCA2, BRIP1, CASR, CDC73, CDH1, CDK4, CDKN1B, CDKN1C, CDKN2A (p14ARF), CDKN2A (p16INK4a), CEBPA, CHEK2, CTNNA1, DICER1, DIS3L2, EGFR (c.2369C>T, p.Thr790Met variant only), EPCAM (Deletion/duplication testing only), FH, FLCN, GATA2, GPC3, GREM1 (Promoter region deletion/duplication testing only), HOXB13 (c.251G>A, p.Gly84Glu), HRAS, KIT, MAX, MEN1, MET, MITF (c.952G>A, p.Glu318Lys variant only), MLH1, MSH2, MSH3, MSH6, MUTYH, NBN, NF1, NF2, NTHL1, PALB2, PDGFRA, PHOX2B, PMS2, POLD1, POLE, POT1, PRKAR1A, PTCH1, PTEN, RAD50, RAD51C, RAD51D, RB1, RECQL4, RET, RUNX1, SDHAF2, SDHA (sequence changes only), SDHB, SDHC, SDHD, SMAD4, SMARCA4, SMARCB1, SMARCE1, STK11, SUFU, TERC, TERT, TMEM127, TP53, TSC1, TSC2, VHL, WRN and WT1.     FAMILY HISTORY:  We obtained a detailed, 4-generation family history.  Significant diagnoses are listed below: Family History  Problem Relation Age of Onset  . Cancer Mother        Bile duct versus gallbladder  . Cancer Sister        Breast  and  bladder    Ms. Fontenot has 1 daughter, age 67, with no history of cancer. Patient had 2 full sisters, one passed when patient was young and the other had breast cancer and bladder cancer diagnosed in her 64s and passed at 24. Patient has 1 niece through this sister. Patient also has 3 paternal half siblings, no cancers.   Ms. Mclees mother had gallbladder cancer at 102 and died at 66. Patient had 3 maternal uncles, 1 aunt, no cancers. No known cancers in maternal cousins or grandparents.   Ms. Braver father died in his 74s. No known cancers on this side of the family.  Ms. Sylvan is unaware of previous family history of genetic testing for hereditary cancer risks. Patient's maternal ancestors are of Black/white descent, and paternal ancestors are of Black descent. There is no reported Ashkenazi Jewish ancestry. There is no known consanguinity.     GENETIC TEST RESULTS: Genetic testing reported out on 11/12/2020 through the [facility]- cancer panel found no pathogenic mutations.   The Multi-Cancer Panel + RNA offered by [facility] includes sequencing and/or deletion duplication testing of the following 84 genes: AIP, ALK, APC, ATM, AXIN2,BAP1,  BARD1, BLM, BMPR1A, BRCA1, BRCA2, BRIP1, CASR, CDC73, CDH1, CDK4, CDKN1B, CDKN1C, CDKN2A (p14ARF), CDKN2A (p16INK4a), CEBPA, CHEK2, CTNNA1, DICER1, DIS3L2, EGFR (c.2369C>T, p.Thr790Met variant only), EPCAM (Deletion/duplication testing only), FH, FLCN, GATA2, GPC3, GREM1 (Promoter region deletion/duplication testing only), HOXB13 (c.251G>A, p.Gly84Glu), HRAS, KIT, MAX, MEN1, MET, MITF (c.952G>A, p.Glu318Lys variant only), MLH1, MSH2, MSH3, MSH6, MUTYH, NBN, NF1, NF2, NTHL1, PALB2, PDGFRA, PHOX2B, PMS2, POLD1, POLE, POT1, PRKAR1A, PTCH1, PTEN, RAD50, RAD51C, RAD51D, RB1, RECQL4, RET, RUNX1, SDHAF2, SDHA (sequence changes only), SDHB, SDHC, SDHD, SMAD4, SMARCA4, SMARCB1, SMARCE1, STK11, SUFU, TERC, TERT, TMEM127, TP53, TSC1, TSC2, VHL, WRN and  WT1.  The test report has been scanned into EPIC and is located under the Molecular Pathology section of the Results Review tab.  A portion of the result report is included below for reference.     We discussed with Ms. Porta that because current genetic testing is not perfect, it is possible there may be a gene mutation in one of these genes that current testing cannot detect, but that chance is small.  We also discussed, that there could be another gene that has not yet been discovered, or that we have not yet tested, that is responsible for the cancer diagnoses in the family. It is also possible there is a hereditary cause for the cancer in the family that Ms. Vialpando did not inherit and therefore was not identified in her testing.  Therefore, it is important to remain in touch with cancer genetics in the future so that we can continue to offer Ms. Rester the most up to date genetic testing.   ADDITIONAL GENETIC TESTING: We discussed with Ms. Sorter that her genetic testing was fairly extensive.  If there are genes identified to increase cancer risk that can be analyzed in the future, we would be happy to discuss and coordinate this testing at that time.    CANCER SCREENING RECOMMENDATIONS: Ms. Massar test result is considered negative (normal).  This means that we have not identified a hereditary cause for her  personal and family history of cancer at this time. Most cancers happen by chance and this negative test suggests that her cancer may fall into this category.    While reassuring, this does not definitively rule out a hereditary predisposition to cancer. It is still possible that there could be genetic  mutations that are undetectable by current technology. There could be genetic mutations in genes that have not been tested or identified to increase cancer risk.  Therefore, it is recommended she continue to follow the cancer management and screening guidelines provided by her oncology and  primary healthcare provider.   An individual's cancer risk and medical management are not determined by genetic test results alone. Overall cancer risk assessment incorporates additional factors, including personal medical history, family history, and any available genetic information that may result in a personalized plan for cancer prevention and surveillance.  RECOMMENDATIONS FOR FAMILY MEMBERS:  Relatives in this family might be at some increased risk of developing cancer, over the general population risk, simply due to the family history of cancer.  We recommended female relatives in this family have a yearly mammogram beginning at age 23, or 56 years younger than the earliest onset of cancer, an annual clinical breast exam, and perform monthly breast self-exams. Female relatives in this family should also have a gynecological exam as recommended by their primary provider.  All family members should be referred for colonoscopy starting at age 2.   FOLLOW-UP: Lastly, we discussed with Ms. Rivard that cancer genetics is a rapidly advancing field and it is possible that new genetic tests will be appropriate for her and/or her family members in the future. We encouraged her to remain in contact with cancer genetics on an annual basis so we can update her personal and family histories and let her know of advances in cancer genetics that may benefit this family.   Our contact number was provided. Ms. Ziehm questions were answered to her satisfaction, and she knows she is welcome to call us at anytime with additional questions or concerns.   Faith Rogue, MS, [facility] Genetic Counselor [email or web address] Phone: [phone]

## 2020-11-13 NOTE — Telephone Encounter (Signed)
Revealed negative genetic testing. This normal result is reassuring and indicates that it is unlikely Toni Stone's cancer is due to a hereditary cause.  It is unlikely that there is an increased risk of another cancer due to a mutation in one of these genes.  However, genetic testing is not perfect, and cannot definitively rule out a hereditary cause.  It will be important for her to keep in contact with genetics to learn if any additional testing may be needed in the future.

## 2020-11-20 ENCOUNTER — Telehealth: Payer: Self-pay

## 2020-11-20 NOTE — Telephone Encounter (Signed)
This nurse spoke with patient, informed her that she has a mammogram and a bone density test scheduled for 03/2021.  Explained that she was overdue for her mammogram in January. Advised per Cira Rue, NP she can call the [facility] and have that appointment moved to this month if she prefers.  Patient acknowledged understanding.  No further questions or concerns at this time.

## 2020-11-20 NOTE — Telephone Encounter (Deleted)
-----   Message from Alla Feeling, NP sent at 11/20/2020  3:01 PM EDT ----- Regarding: RE: Juluis Rainier Thanks for your message, Helene Kelp.   Wilberth Damon, she is scheduled for mammogram and bone density at breast center in 03/2021, but she was overdue for mammo in January. She can call breast center to move it to this month.   Thanks, Regan Rakers  ----- Message ----- From: Kipp Laurence, PT Sent: 11/12/2020   4:14 PM EDT To: Alla Feeling, NP Subject: Marni Griffon,  I saw 2 of your people this afternoon! :)  Mrs. Bumpass at first thought she was coming here for a mammogram, but then realized she was here for PT for her breast lymphedema.  She  never made an appointment for a mammogram and is not sure where she should go for that ???  Could you please clarify that for her?  And I saw Meryl Dare and his caregiver. Diamond, today.  He definitely has proximal weakness and decreased endurance. I suggested they get a 3-in-1 to put over his commode to help him with commode transfers at home. We set him up to come here on weeks 2 and 3 of his chemo cycle. I really wish HHPT could see him in a more timely manner as he easily fatigued and it takesa lot to get dressed and get here. But we will see him for now and he is motivated to come.   I will make sure he understands that he can get [facility] or outpt and that Medicare won't pay for both. I just wanted to make sure you knew he was seen.    I'll be sending you certifications:) Thank you! Helene Kelp

## 2020-11-26 ENCOUNTER — Ambulatory Visit: Payer: Medicare HMO | Attending: Nurse Practitioner

## 2020-11-26 ENCOUNTER — Other Ambulatory Visit: Payer: Self-pay

## 2020-11-26 DIAGNOSIS — M6281 Muscle weakness (generalized): Secondary | ICD-10-CM | POA: Diagnosis present

## 2020-11-26 DIAGNOSIS — R262 Difficulty in walking, not elsewhere classified: Secondary | ICD-10-CM | POA: Insufficient documentation

## 2020-11-26 DIAGNOSIS — M25612 Stiffness of left shoulder, not elsewhere classified: Secondary | ICD-10-CM | POA: Diagnosis present

## 2020-11-26 DIAGNOSIS — I890 Lymphedema, not elsewhere classified: Secondary | ICD-10-CM | POA: Insufficient documentation

## 2020-11-26 DIAGNOSIS — M25611 Stiffness of right shoulder, not elsewhere classified: Secondary | ICD-10-CM | POA: Diagnosis present

## 2020-11-26 NOTE — Therapy (Signed)
[place]oni Stone, [place], [address] Phone: [phone]   Fax:  [phone]  Physical Therapy Treatment  Patient Details  Name: Toni Stone MRN: [id number] Date of Birth: Mar 25, 1946 Referring Provider (PT): Cira Rue   Encounter Date: 11/26/2020   PT End of Session - 11/26/20 1215    Visit Number 2    Number of Visits 9    Date for PT Re-Evaluation 12/15/20    PT Start Time 1010    PT Stop Time 1055    PT Time Calculation (min) 45 min    Activity Tolerance Patient tolerated treatment well    Behavior During Therapy [facility] for tasks assessed/performed           Past Medical History:  Diagnosis Date  . Anemia   . Breast cancer ([place])   . Chronic kidney disease   . Diabetes mellitus without complication ([place])   . Family history of bladder cancer   . Family history of breast cancer   . Hypertension   . Neuromuscular disorder ([facility])    Tremor  . Personal history of breast cancer     Past Surgical History:  Procedure Laterality Date  . ABDOMINAL HYSTERECTOMY     Age 75s, for ectopic pregnancy  . BREAST LUMPECTOMY Right 06/23/2004   pT1cN0M0 ER/PR + HER2 - stage IA  . BREAST LUMPECTOMY Left 08/23/2018   pT2N0M0 ER/PR/HER2 neg stage IIA    There were no vitals filed for this visit.   Subjective Assessment - 11/26/20 1005    Subjective Pt made an appt to get her compression bra but couldnt get one until the end of the month.    Pertinent History right breast cancer in 2007 with chemo, lumpectomy and radiation and arimedex . left breast cancer 06/2018 lumpectomy with post op swelling that opened up and drained , chemo and radiation,  Pt was treated for this in [place] . She moved to [facility] in Nov. 2021 Past history includes tremors of her hands left > right, IDDM, HTN, sometimes her right knee will "go out " her so she uses a quad cane    Patient Stated Goals Decrease breast swelling    Currently  in Pain? Yes    Pain Score 6     Pain Location Knee    Pain Orientation Right    Pain Descriptors / Indicators Nagging;Aching    Pain Type Chronic pain    Pain Onset More than a month ago    Pain Frequency Intermittent    Aggravating Factors  weightbearing, getting up and down    Pain Relieving Factors quad cane                             [facility] Adult PT Treatment/Exercise - 11/26/20 0001      Manual Therapy   Edema Management Pt to be fit for new compression bra at end of the month    Manual Lymphatic Drainage (MLD) MLD to short neck, left axillary and Inguinal LN's left axilloinguinal pathway and left breast moving all towards left anterior inguinal pathway.  Educated pt verbally. while I was performing but did not have pt try any herself. Head of table elevated                      PT Long Term Goals - 11/26/20 1220      PT LONG TERM GOAL #1  Title Pt will have new compression bras and demonstrate ability to perform self MLD and use of compression to help manage her left breast lymphedema    Time 4    Period Weeks    Status New      PT LONG TERM GOAL #2   Title Pt will be independent in a home exercise program for shoulder ROM and strength as remedial exercise for lymphedema    Time 4    Period Weeks    Status New      PT LONG TERM GOAL #3   Title Pt will have a decrease in 2 cm when measured around widest part of chest and back    Baseline 147 cm    Time 4    Period Weeks    Status New                 Plan - 11/26/20 1216    Clinical Impression Statement Pt will be measured for a new compression bra at the end of the month howver, pt is unable to zip her bra herself so she pulls it on over her head.  She is wearing a H[employer]icult[employer], commercial purchased previously on line.  Her cane was really high so it was lowered to a more reasonable level today and she was instructed in the proper way to use her cane, but instead fell into her old  habits.  Treatment consisted of measuring the largest part of her chest for goal that is established and performing MLD to left breast.  Pt was supine with head of table elevated but still SOB and she indicated she always is. Medial breast did seem to soften some with MLD.    Personal Factors and Comorbidities Comorbidity 3+    Comorbidities bilateral breast cancer and treatment, DM, HTN hyperlipidemia, obesity, knee arthritis, bilateral hand tremors    Examination-Activity Limitations Reach Overhead;Caring for Others;Hygiene/Grooming;Locomotion Level    Examination-Participation Restrictions Community Activity;Driving;Medication Management;Meal Prep;Cleaning;Laundry;Volunteer;Yard Work;Shop    Stability/Clinical Decision Making Stable/Uncomplicated    Rehab Potential Good    PT Frequency 2x / week    PT Duration 4 weeks    PT Treatment/Interventions Therapeutic activities;Therapeutic exercise;Patient/family education;Manual lymph drainage;Compression bandaging;Scar mobilization    PT Next Visit Plan Perform and teach MLD, see if pt got her compression bras. teach shoulder ROM and progress exercise program. ???Flexitouch??? Later, once lymphedema goals are met, re-eval for strength and mobility with new order and cert if pt wants to complete PT to improve mobility    Consulted and Agree with Plan of Care Patient           Patient will benefit from skilled therapeutic intervention in order to improve the following deficits and impairments:  Increased edema,Decreased strength,Decreased mobility,Decreased skin integrity,Impaired UE functional use,Decreased range of motion  Visit Diagnosis: Lymphedema, not elsewhere classified  Stiffness of left shoulder, not elsewhere classified  Stiffness of right shoulder, not elsewhere classified  Muscle weakness (generalized)  Difficulty in walking, not elsewhere classified     Problem List Patient Active Problem List   Diagnosis Date Noted  .  Genetic testing 11/13/2020  . Family history of breast cancer   . Family history of bladder cancer   . Personal history of breast cancer   . Malignant neoplasm of left breast in female, estrogen receptor negative ([place]) 10/23/2020  . Malignant neoplasm of right breast in female, estrogen receptor positive ([place]) 10/07/2020  . Depression 10/07/2020  . Diabetes mellitus ([place]) 10/07/2020  .  Hypercholesterolemia 10/07/2020  . Hypertension 10/07/2020  . Tremor of both hands 10/07/2020  . History of gout 09/26/2020    Claris Pong 11/26/2020, 12:21 PM  [facility] [place], [place], [address] Phone: [phone]   Fax:  [phone]  Name: Kazandra Forstrom MRN: [id number] Date of Birth: January 05, 1946 Cheral Almas, PT 11/26/20 12:23 PM

## 2020-11-27 ENCOUNTER — Encounter: Payer: Medicare HMO | Admitting: Physical Therapy

## 2020-12-01 ENCOUNTER — Other Ambulatory Visit: Payer: Self-pay

## 2020-12-01 ENCOUNTER — Ambulatory Visit: Payer: Medicare HMO

## 2020-12-01 DIAGNOSIS — M25612 Stiffness of left shoulder, not elsewhere classified: Secondary | ICD-10-CM

## 2020-12-01 DIAGNOSIS — R262 Difficulty in walking, not elsewhere classified: Secondary | ICD-10-CM

## 2020-12-01 DIAGNOSIS — I890 Lymphedema, not elsewhere classified: Secondary | ICD-10-CM

## 2020-12-01 DIAGNOSIS — M6281 Muscle weakness (generalized): Secondary | ICD-10-CM

## 2020-12-01 DIAGNOSIS — M25611 Stiffness of right shoulder, not elsewhere classified: Secondary | ICD-10-CM

## 2020-12-01 NOTE — Therapy (Signed)
[place], [place], [address] Phone: [phone]   Fax:  [phone]  Physical Therapy Treatment  Patient Details  Name: Toni Stone MRN: [id number] Date of Birth: 14-Feb-1946 Referring Provider (PT): Cira Rue   Encounter Date: 12/01/2020   PT End of Session - 12/01/20 1459     Visit Number 4    Number of Visits 9    Date for PT Re-Evaluation 12/15/20    PT Start Time 7078    PT Stop Time 1450    PT Time Calculation (min) 46 min             Past Medical History:  Diagnosis Date   Anemia    Breast cancer ([place])    Chronic kidney disease    Diabetes mellitus without complication ([place])    Family history of bladder cancer    Family history of breast cancer    Hypertension    Neuromuscular disorder ([place])    Tremor   Personal history of breast cancer     Past Surgical History:  Procedure Laterality Date   ABDOMINAL HYSTERECTOMY     Age 69s, for ectopic pregnancy   BREAST LUMPECTOMY Right 06/23/2004   pT1cN0M0 ER/PR + HER2 - stage IA   BREAST LUMPECTOMY Left 08/23/2018   pT2N0M0 ER/PR/HER2 neg stage IIA    There were no vitals filed for this visit.   Subjective Assessment - 12/01/20 1403     Subjective Pt reports she is getting used to her cane at a lower height.  I had to urinate alot after I was here last.  Pt reports trying to do a little bit of the drainage    Pertinent History right breast cancer in 2007 with chemo, lumpectomy and radiation and arimedex . left breast cancer 06/2018 lumpectomy with post op swelling that opened up and drained , chemo and radiation,  Pt was treated for this in [place] . She moved to [facility] in Nov. 2021 Past history includes tremors of her hands left > right, IDDM, HTN, sometimes her right knee will "go out " her so she uses a quad cane    Currently in Pain? Yes    Pain Score 5     Pain Location Knee    Pain Orientation Right    Pain  Descriptors / Indicators Aching;Nagging    Pain Type Chronic pain    Pain Onset More than a month ago    Pain Frequency Intermittent                               [facility] Adult PT Treatment/Exercise - 12/01/20 0001       Shoulder Exercises: Supine   Other Supine Exercises supine AA ROM for flexion, stargazer x 3      Manual Therapy   Manual Lymphatic Drainage (MLD) PROM left shoulder flex, abd, ER                    PT Education - 12/01/20 1457     Education Details pt was educated in [employer] and Educational psychologist for [employer]) Educated Patient    Methods Explanation;Handout    Comprehension Verbalized understanding                 PT Long Term Goals - 11/26/20 1220       PT LONG TERM GOAL #1   Title Pt  will have new compression bras and demonstrate ability to perform self MLD and use of compression to help manage her left breast lymphedema    Time 4    Period Weeks    Status New      PT LONG TERM GOAL #2   Title Pt will be independent in a home exercise program for shoulder ROM and strength as remedial exercise for lymphedema    Time 4    Period Weeks    Status New      PT LONG TERM GOAL #3   Title Pt will have a decrease in 2 cm when measured around widest part of chest and back    Baseline 147 cm    Time 4    Period Weeks    Status New                   Plan - 12/01/20 1453     Clinical Impression Statement Therapy consisted of AAROM for shoulder flexion with both hands, and stargazer stretch, Left breast MLD to decrease swelling, and PROM of the left shoulder.  Started instructing pt however pt has difficulty reaching to her left with the right arm, and left UE has intentioned tremors which makes it difficult for her.  There was only very mild fibrosis at the medial breast which improved with MLD.  The left breast continues to be much larger.   Personal Factors and Comorbidities Comorbidity 3+    Comorbidities  bilateral breast cancer and treatment, DM, HTN hyperlipidemia, obesity, knee arthritis, bilateral hand tremors    Examination-Activity Limitations Reach Overhead;Caring for Others;Hygiene/Grooming;Locomotion Level    Examination-Participation Restrictions Community Activity;Driving;Medication Management;Meal Prep;Cleaning;Laundry;Volunteer;Yard Work;Shop    Stability/Clinical Decision Making Stable/Uncomplicated    Rehab Potential Good    PT Frequency 2x / week    PT Duration 4 weeks    PT Treatment/Interventions Therapeutic activities;Therapeutic exercise;Patient/family education;Manual lymph drainage;Compression bandaging;Scar mobilization    PT Next Visit Plan Perform and teach MLD, see if pt got her compression bras. teach shoulder ROM and progress exercise program. ???Flexitouch??? Later, once lymphedema goals are met, re-eval for strength and mobility with new order and cert if pt wants to complete PT to improve mobility    PT Home Exercise Plan supine AA ROM flexion and stargazer    Consulted and Agree with Plan of Care Patient             Patient will benefit from skilled therapeutic intervention in order to improve the following deficits and impairments:  Increased edema, Decreased strength, Decreased mobility, Decreased skin integrity, Impaired UE functional use, Decreased range of motion  Visit Diagnosis: Lymphedema, not elsewhere classified  Stiffness of left shoulder, not elsewhere classified  Stiffness of right shoulder, not elsewhere classified  Muscle weakness (generalized)  Difficulty in walking, not elsewhere classified     Problem List Patient Active Problem List   Diagnosis Date Noted   Genetic testing 11/13/2020   Family history of breast cancer    Family history of bladder cancer    Personal history of breast cancer    Malignant neoplasm of left breast in female, estrogen receptor negative ([place]) 10/23/2020   Malignant neoplasm of right breast in female,  estrogen receptor positive ([place]) 10/07/2020   Depression 10/07/2020   Diabetes mellitus ([place]) 10/07/2020   Hypercholesterolemia 10/07/2020   Hypertension 10/07/2020   Tremor of both hands 10/07/2020   History of gout 09/26/2020    Elsie Ra Tyson Parkison 12/01/2020, 3:04 PM  [facility]  [place], [place], [address] Phone: [phone]   Fax:  [phone]  Name: Carolin Quang MRN: [id number] Date of Birth: [birth year] Cheral Almas, PT 12/01/20 3:05 PM

## 2020-12-01 NOTE — Patient Instructions (Signed)
Pt was given illustrated and written instructions for supine or reclined AAshoulder flexion and stargazer

## 2020-12-03 ENCOUNTER — Other Ambulatory Visit: Payer: Self-pay

## 2020-12-03 ENCOUNTER — Ambulatory Visit: Payer: Medicare HMO

## 2020-12-03 DIAGNOSIS — M6281 Muscle weakness (generalized): Secondary | ICD-10-CM

## 2020-12-03 DIAGNOSIS — I890 Lymphedema, not elsewhere classified: Secondary | ICD-10-CM | POA: Diagnosis not present

## 2020-12-03 DIAGNOSIS — R262 Difficulty in walking, not elsewhere classified: Secondary | ICD-10-CM

## 2020-12-03 DIAGNOSIS — M25611 Stiffness of right shoulder, not elsewhere classified: Secondary | ICD-10-CM

## 2020-12-03 DIAGNOSIS — M25612 Stiffness of left shoulder, not elsewhere classified: Secondary | ICD-10-CM

## 2020-12-03 NOTE — Therapy (Signed)
[place], [place], [address] Phone: [phone]   Fax:  [phone]  Physical Therapy Treatment  Patient Details  Name: Toni Stone MRN: [id number] Date of Birth: 23-Mar-1946 Referring Provider (PT): Cira Rue   Encounter Date: 12/03/2020   PT End of Session - 12/03/20 1456     Visit Number 5    Number of Visits 9    Date for PT Re-Evaluation 12/15/20    PT Start Time 8270    PT Stop Time 7867    PT Time Calculation (min) 44 min    Activity Tolerance Patient tolerated treatment well    Behavior During Therapy [facility] for tasks assessed/performed             Past Medical History:  Diagnosis Date   Anemia    Breast cancer ([place])    Chronic kidney disease    Diabetes mellitus without complication ([place])    Family history of bladder cancer    Family history of breast cancer    Hypertension    Neuromuscular disorder ([place])    Tremor   Personal history of breast cancer     Past Surgical History:  Procedure Laterality Date   ABDOMINAL HYSTERECTOMY     Age 38s, for ectopic pregnancy   BREAST LUMPECTOMY Right 06/23/2004   pT1cN0M0 ER/PR + HER2 - stage IA   BREAST LUMPECTOMY Left 08/23/2018   pT2N0M0 ER/PR/HER2 neg stage IIA    There were no vitals filed for this visit.   Subjective Assessment - 12/03/20 1409     Subjective I feel the need to urinate after we finish the MLD and sometimes at other times. Did well after last visit.  I was tired afterward.  I did some at home and they felt good.  I noticed last night that the upper part of my breast seemed looser and not to heavy.    Pertinent History right breast cancer in 2007 with chemo, lumpectomy and radiation and arimedex . left breast cancer 06/2018 lumpectomy with post op swelling that opened up and drained , chemo and radiation,  Pt was treated for this in [place] . She moved to [facility] in Nov. 2021 Past history includes tremors of  her hands left > right, IDDM, HTN, sometimes her right knee will "go out " her so she uses a quad cane    Patient Stated Goals Decrease breast swelling    Currently in Pain? Yes    Pain Score 5     Pain Location Knee    Pain Orientation Right    Pain Descriptors / Indicators Aching;Nagging    Pain Type Chronic pain    Pain Onset More than a month ago    Pain Frequency Intermittent                               [facility] Adult PT Treatment/Exercise - 12/03/20 0001       Shoulder Exercises: Supine   Other Supine Exercises supine AA ROM for flexion, stargazer x 3    Other Supine Exercises supine arm circles 3  x 10 ea direction, AROM flexion x 5      Manual Therapy   Manual Lymphatic Drainage (MLD) MLD to short neck,5 breaths, left axillary and right axillary and Inguinal LN's left axilloinguinal pathway and left breast moving all towards left anterior inguinal pathway.  Educated pt verbally while I was performing but  did not have pt try any herself.    Passive ROM PROM left shoulder                         PT Long Term Goals - 11/26/20 1220       PT LONG TERM GOAL #1   Title Pt will have new compression bras and demonstrate ability to perform self MLD and use of compression to help manage her left breast lymphedema    Time 4    Period Weeks    Status New      PT LONG TERM GOAL #2   Title Pt will be independent in a home exercise program for shoulder ROM and strength as remedial exercise for lymphedema    Time 4    Period Weeks    Status New      PT LONG TERM GOAL #3   Title Pt will have a decrease in 2 cm when measured around widest part of chest and back    Baseline 147 cm    Time 4    Period Weeks    Status New                   Plan - 12/03/20 1456     Clinical Impression Statement Pt has been compliant with shoulder ROM HEP and we added some supine strength today.  Continued MLD for left breast swelling.  Pts upper breast  looks reduced and pt notices this as well, however the lateral breast remains swollen with some fibrosis medially as well. Will instruct pt in sitting next visit to see if she can reach any better    Personal Factors and Comorbidities Comorbidity 3+    Comorbidities bilateral breast cancer and treatment, DM, HTN hyperlipidemia, obesity, knee arthritis, bilateral hand tremors    Examination-Activity Limitations Reach Overhead;Caring for Others;Hygiene/Grooming;Locomotion Level    Examination-Participation Restrictions Community Activity;Driving;Medication Management;Meal Prep;Cleaning;Laundry;Volunteer;Yard Work;Shop    Stability/Clinical Decision Making Stable/Uncomplicated    Rehab Potential Good    PT Frequency 2x / week    PT Duration 4 weeks    PT Treatment/Interventions Therapeutic activities;Therapeutic exercise;Patient/family education;Manual lymph drainage;Compression bandaging;Scar mobilization    PT Next Visit Plan teach MLD in sitting so pt can see and reach, continue ROM    PT Home Exercise Plan supine AA ROM flexion and stargazer, supine arm circles    Consulted and Agree with Plan of Care Patient             Patient will benefit from skilled therapeutic intervention in order to improve the following deficits and impairments:  Increased edema, Decreased strength, Decreased mobility, Decreased skin integrity, Impaired UE functional use, Decreased range of motion  Visit Diagnosis: Lymphedema, not elsewhere classified  Stiffness of left shoulder, not elsewhere classified  Muscle weakness (generalized)  Stiffness of right shoulder, not elsewhere classified  Difficulty in walking, not elsewhere classified     Problem List Patient Active Problem List   Diagnosis Date Noted   Genetic testing 11/13/2020   Family history of breast cancer    Family history of bladder cancer    Personal history of breast cancer    Malignant neoplasm of left breast in female, estrogen  receptor negative ([place]) 10/23/2020   Malignant neoplasm of right breast in female, estrogen receptor positive ([place]) 10/07/2020   Depression 10/07/2020   Diabetes mellitus ([place]) 10/07/2020   Hypercholesterolemia 10/07/2020   Hypertension 10/07/2020   Tremor of both hands 10/07/2020  History of gout 09/26/2020    Claris Pong 12/03/2020, 3:00 PM  [place], [place], [address] Phone: [phone]   Fax:  [phone]  Name: Toni Stone MRN: [id number] Date of Birth: [birth year] Cheral Almas, PT 12/03/20 3:01 PM

## 2020-12-08 ENCOUNTER — Other Ambulatory Visit: Payer: Self-pay

## 2020-12-08 ENCOUNTER — Ambulatory Visit: Payer: Medicare HMO

## 2020-12-08 DIAGNOSIS — I890 Lymphedema, not elsewhere classified: Secondary | ICD-10-CM | POA: Diagnosis not present

## 2020-12-08 DIAGNOSIS — M25612 Stiffness of left shoulder, not elsewhere classified: Secondary | ICD-10-CM

## 2020-12-08 DIAGNOSIS — R262 Difficulty in walking, not elsewhere classified: Secondary | ICD-10-CM

## 2020-12-08 DIAGNOSIS — M25611 Stiffness of right shoulder, not elsewhere classified: Secondary | ICD-10-CM

## 2020-12-08 DIAGNOSIS — M6281 Muscle weakness (generalized): Secondary | ICD-10-CM

## 2020-12-08 NOTE — Patient Instructions (Signed)
Pt was given written instructions for left breast MLD

## 2020-12-08 NOTE — Therapy (Signed)
[place], [place], [address] Phone: [phone]   Fax:  [phone]  Physical Therapy Treatment  Patient Details  Name: Toni Stone MRN: [id number] Date of Birth: 1945-08-19 Referring Provider (PT): Cira Rue   Encounter Date: 12/08/2020   PT End of Session - 12/08/20 1726     Visit Number 6    Number of Visits 9    Date for PT Re-Evaluation 12/15/20    PT Start Time 3149    PT Stop Time 1500    PT Time Calculation (min) 49 min    Activity Tolerance Patient tolerated treatment well    Behavior During Therapy [facility] for tasks assessed/performed             Past Medical History:  Diagnosis Date   Anemia    Breast cancer ([place])    Chronic kidney disease    Diabetes mellitus without complication ([place])    Family history of bladder cancer    Family history of breast cancer    Hypertension    Neuromuscular disorder ([place])    Tremor   Personal history of breast cancer     Past Surgical History:  Procedure Laterality Date   ABDOMINAL HYSTERECTOMY     Age 40s, for ectopic pregnancy   BREAST LUMPECTOMY Right 06/23/2004   pT1cN0M0 ER/PR + HER2 - stage IA   BREAST LUMPECTOMY Left 08/23/2018   pT2N0M0 ER/PR/HER2 neg stage IIA    There were no vitals filed for this visit.   Subjective Assessment - 12/08/20 1414     Subjective I still feel like the swelling is better especially near the top and under the arm.  I feel less heaviness.  I am doing the exs at home as well.    Pertinent History right breast cancer in 2007 with chemo, lumpectomy and radiation and arimedex . left breast cancer 06/2018 lumpectomy with post op swelling that opened up and drained , chemo and radiation,  Pt was treated for this in [place] . She moved to [facility] in Nov. 2021 Past history includes tremors of her hands left > right, IDDM, HTN, sometimes her right knee will "go out " her so she uses a quad cane                                [facility] Adult PT Treatment/Exercise - 12/08/20 0001       Manual Therapy   Edema Management Pt instructed in self MLD to left breast in sitting where she could watch PT demonstration and practice.  After pt performed in sitting she layed with head of table elevated and PT performed as noted below (P)     Manual Lymphatic Drainage (MLD) MLD to short neck,5 breaths, left axillary and right axillary and left Inguinal LN's left axilloinguinal pathway, and interaxillary pathway and left breast redirected to appropriate pathway..  Educated pt verbally while I was performing. (P)                     PT Education - 12/08/20 1716     Education Details Pt was educated in left breast MLD.    Person(s) Educated Patient    Methods Demonstration;Explanation;Handout    Comprehension Returned demonstration;Need further instruction;Verbal cues required;Tactile cues required                 PT Long Term Goals - 11/26/20 1220  PT LONG TERM GOAL #1   Title Pt will have new compression bras and demonstrate ability to perform self MLD and use of compression to help manage her left breast lymphedema    Time 4    Period Weeks    Status New      PT LONG TERM GOAL #2   Title Pt will be independent in a home exercise program for shoulder ROM and strength as remedial exercise for lymphedema    Time 4    Period Weeks    Status New      PT LONG TERM GOAL #3   Title Pt will have a decrease in 2 cm when measured around widest part of chest and back    Baseline 147 cm    Time 4    Period Weeks    Status New                   Plan - 12/08/20 1727     Clinical Impression Statement Pt was instructed in self MLD to the left breast.  She did quite well with all steps using the right hand except she had difficulty fully reaching her left trunk and lateral breast with her right hand.  The tremors in her left hand made use of that hand  somewhat difficult.  She was given illustrated and written instructions and plans to practice at home. She does note that her breast is less heavy, however, it is still noticeably larger.    Personal Factors and Comorbidities Comorbidity 3+    Comorbidities bilateral breast cancer and treatment, DM, HTN hyperlipidemia, obesity, knee arthritis, bilateral hand tremors    Examination-Activity Limitations Reach Overhead;Caring for Others;Hygiene/Grooming;Locomotion Level    Examination-Participation Restrictions Community Activity;Driving;Medication Management;Meal Prep;Cleaning;Laundry;Volunteer;Yard Work;Shop    Stability/Clinical Decision Making Stable/Uncomplicated    Rehab Potential Good    PT Frequency 2x / week    PT Duration 4 weeks    PT Treatment/Interventions Therapeutic activities;Therapeutic exercise;Patient/family education;Manual lymph drainage;Compression bandaging;Scar mobilization    PT Next Visit Plan Continue MLD, teach MLD in sitting so pt can see and reach, continue ROM, will send info to flexitouch    PT Home Exercise Plan supine AA ROM flexion and stargazer, supine arm circles, self left breast MLD    Consulted and Agree with Plan of Care Patient             Patient will benefit from skilled therapeutic intervention in order to improve the following deficits and impairments:  Increased edema, Decreased strength, Decreased mobility, Decreased skin integrity, Impaired UE functional use, Decreased range of motion  Visit Diagnosis: Lymphedema, not elsewhere classified  Stiffness of left shoulder, not elsewhere classified  Muscle weakness (generalized)  Stiffness of right shoulder, not elsewhere classified  Difficulty in walking, not elsewhere classified     Problem List Patient Active Problem List   Diagnosis Date Noted   Genetic testing 11/13/2020   Family history of breast cancer    Family history of bladder cancer    Personal history of breast cancer     Malignant neoplasm of left breast in female, estrogen receptor negative ([place]) 10/23/2020   Malignant neoplasm of right breast in female, estrogen receptor positive ([place]) 10/07/2020   Depression 10/07/2020   Diabetes mellitus ([place]) 10/07/2020   Hypercholesterolemia 10/07/2020   Hypertension 10/07/2020   Tremor of both hands 10/07/2020   History of gout 09/26/2020    Claris Pong 12/08/2020, 5:33 PM  [facility]  [place], [place], [address] Phone: [phone]   Fax:  [phone]  Name: Toni Stone MRN: [id number] Date of Birth: 08/29/1945 Cheral Almas, PT 12/08/20 5:34 PM

## 2020-12-10 ENCOUNTER — Other Ambulatory Visit: Payer: Self-pay

## 2020-12-10 ENCOUNTER — Ambulatory Visit: Payer: Medicare HMO | Admitting: Physical Therapy

## 2020-12-10 DIAGNOSIS — M25612 Stiffness of left shoulder, not elsewhere classified: Secondary | ICD-10-CM

## 2020-12-10 DIAGNOSIS — I890 Lymphedema, not elsewhere classified: Secondary | ICD-10-CM | POA: Diagnosis not present

## 2020-12-10 DIAGNOSIS — M6281 Muscle weakness (generalized): Secondary | ICD-10-CM

## 2020-12-10 DIAGNOSIS — M25611 Stiffness of right shoulder, not elsewhere classified: Secondary | ICD-10-CM

## 2020-12-10 NOTE — Therapy (Signed)
[place], [place], [address] Phone: [phone]   Fax:  [phone]  Physical Therapy Treatment  Patient Details  Name: Toni Stone MRN: [id number] Date of Birth: 11-02-1945 Referring Provider (PT): Cira Rue   Encounter Date: 12/10/2020   PT End of Session - 12/10/20 1457     Visit Number 7    Number of Visits 9    Date for PT Re-Evaluation 12/15/20    PT Start Time 1410    PT Stop Time 1450    PT Time Calculation (min) 40 min    Activity Tolerance Patient tolerated treatment well    Behavior During Therapy [facility] for tasks assessed/performed             Past Medical History:  Diagnosis Date   Anemia    Breast cancer ([place])    Chronic kidney disease    Diabetes mellitus without complication ([place])    Family history of bladder cancer    Family history of breast cancer    Hypertension    Neuromuscular disorder ([place])    Tremor   Personal history of breast cancer     Past Surgical History:  Procedure Laterality Date   ABDOMINAL HYSTERECTOMY     Age 30s, for ectopic pregnancy   BREAST LUMPECTOMY Right 06/23/2004   pT1cN0M0 ER/PR + HER2 - stage IA   BREAST LUMPECTOMY Left 08/23/2018   pT2N0M0 ER/PR/HER2 neg stage IIA    There were no vitals filed for this visit.   Subjective Assessment - 12/10/20 1407     Subjective Pt got her Flexitouch trial this morning and it felt good!  She will have it ordered. Pt states she goes to [place] [facility] [place] next week to get more [place] bras.    Pertinent History right breast cancer in 2007 with chemo, lumpectomy and radiation and arimedex . left breast cancer 06/2018 lumpectomy with post op swelling that opened up and drained , chemo and radiation,  Pt was treated for this in [place] . She moved to [facility] in Nov. 2021 Past history includes tremors of her hands left > right, IDDM, HTN, sometimes her right knee will "go out " her so she uses a quad  cane    Currently in Pain? Yes    Pain Score 6     Pain Location Knee    Pain Orientation Right    Pain Descriptors / Indicators Aching;Nagging                               [facility] Adult PT Treatment/Exercise - 12/10/20 0001       Manual Therapy   Manual Therapy Manual Lymphatic Drainage (MLD);Edema management    Manual therapy comments gave pt piece of small dotted foam to wear in bra at inferior aspect of breast    Manual Lymphatic Drainage (MLD) MLD to short neck,5 breaths, left axillary and right axillary and Inguinal LN's left axilloinguinal pathway and left breast moving all towards left anterior inguinal pathway. Then to right sidleying for posterior interaxillary anastamosis.                         PT Long Term Goals - 11/26/20 1220       PT LONG TERM GOAL #1   Title Pt will have new compression bras and demonstrate ability to perform self MLD and use of compression to  help manage her left breast lymphedema    Time 4    Period Weeks    Status New      PT LONG TERM GOAL #2   Title Pt will be independent in a home exercise program for shoulder ROM and strength as remedial exercise for lymphedema    Time 4    Period Weeks    Status New      PT LONG TERM GOAL #3   Title Pt will have a decrease in 2 cm when measured around widest part of chest and back    Baseline 147 cm    Time 4    Period Weeks    Status New                   Plan - 12/10/20 1458     Clinical Impression Statement Pt is improving with softening of breast and noticeabley less congestion in left upper quadrant.  She says she can even breathe better.  Pt will be getting the Flexitouch and new compression bras soon.  showed her the extender for her Prairiewear that may allow more comprssion up toward her axilla Pt states she is trying to do the massage and Left UE exercise at home    Personal Factors and Comorbidities Comorbidity 3+    Comorbidities bilateral  breast cancer and treatment, DM, HTN hyperlipidemia, obesity, knee arthritis, bilateral hand tremors    Examination-Participation Restrictions Community Activity;Driving;Medication Management;Meal Prep;Cleaning;Laundry;Volunteer;Yard Work;Shop    PT Frequency 2x / week    PT Duration 4 weeks    PT Treatment/Interventions Therapeutic activities;Therapeutic exercise;Patient/family education;Manual lymph drainage;Compression bandaging;Scar mobilization    PT Next Visit Plan Continue MLD, teach MLD in sitting so pt can see and reach, continue ROM,    Consulted and Agree with Plan of Care Patient             Patient will benefit from skilled therapeutic intervention in order to improve the following deficits and impairments:  Increased edema, Decreased strength, Decreased mobility, Decreased skin integrity, Impaired UE functional use, Decreased range of motion  Visit Diagnosis: Lymphedema, not elsewhere classified  Stiffness of left shoulder, not elsewhere classified  Muscle weakness (generalized)  Stiffness of right shoulder, not elsewhere classified     Problem List Patient Active Problem List   Diagnosis Date Noted   Genetic testing 11/13/2020   Family history of breast cancer    Family history of bladder cancer    Personal history of breast cancer    Malignant neoplasm of left breast in female, estrogen receptor negative ([place]) 10/23/2020   Malignant neoplasm of right breast in female, estrogen receptor positive (Toni Stone) 10/07/2020   Depression 10/07/2020   Diabetes mellitus ([place]oni Stone) 10/07/2020   Hypercholesterolemia 10/07/2020   Hypertension 10/07/2020   Tremor of both hands 10/07/2020   History of gout 09/26/2020   Donato Heinz. Owens Shark PT  Norwood Levo 12/10/2020, 3:01 PM  [place], [place], [address] Phone: [phone]   Fax:  [phone]  Name: Toni Stone MRN: [id number] Date of  Birth: 03-Oct-1945

## 2020-12-15 ENCOUNTER — Ambulatory Visit: Payer: Medicare HMO

## 2020-12-17 ENCOUNTER — Encounter: Payer: Self-pay | Admitting: Physical Therapy

## 2020-12-17 ENCOUNTER — Ambulatory Visit: Payer: Medicare HMO | Admitting: Physical Therapy

## 2020-12-17 ENCOUNTER — Other Ambulatory Visit: Payer: Self-pay

## 2020-12-17 DIAGNOSIS — M6281 Muscle weakness (generalized): Secondary | ICD-10-CM

## 2020-12-17 DIAGNOSIS — I890 Lymphedema, not elsewhere classified: Secondary | ICD-10-CM

## 2020-12-17 DIAGNOSIS — M25612 Stiffness of left shoulder, not elsewhere classified: Secondary | ICD-10-CM

## 2020-12-17 NOTE — Therapy (Addendum)
[place], [place], [address] Phone: [phone]   Fax:  [phone]  Physical Therapy Treatment  Patient Details  Name: Toni Stone MRN: [id number] Date of Birth: 14-Feb-1946 Referring Provider (PT): Cira Rue   Encounter Date: 12/17/2020   PT End of Session - 12/17/20 1612     Visit Number 8    Number of Visits 17    Date for PT Re-Evaluation 01/14/21    PT Start Time 4008    PT Stop Time 1455    PT Time Calculation (min) 50 min    Activity Tolerance Patient tolerated treatment well    Behavior During Therapy [facility] for tasks assessed/performed             Past Medical History:  Diagnosis Date   Anemia    Breast cancer ([place])    Chronic kidney disease    Diabetes mellitus without complication ([place])    Family history of bladder cancer    Family history of breast cancer    Hypertension    Neuromuscular disorder ([place])    Tremor   Personal history of breast cancer     Past Surgical History:  Procedure Laterality Date   ABDOMINAL HYSTERECTOMY     Age 89s, for ectopic pregnancy   BREAST LUMPECTOMY Right 06/23/2004   pT1cN0M0 ER/PR + HER2 - stage IA   BREAST LUMPECTOMY Left 08/23/2018   pT2N0M0 ER/PR/HER2 neg stage IIA    There were no vitals filed for this visit.   Subjective Assessment - 12/17/20 1409     Subjective Pt says she got new bras and that went well. She is having some trouble with her knees today    Pertinent History right breast cancer in 2007 with chemo, lumpectomy and radiation and arimedex . left breast cancer 06/2018 lumpectomy with post op swelling that opened up and drained , chemo and radiation,  Pt was treated for this in [place] . She moved to [facility] in Nov. 2021 Past history includes tremors of her hands left > right, IDDM, HTN, sometimes her right knee will "go out " her so she uses a quad cane    Patient Stated Goals Decrease breast swelling    Currently  in Pain? Yes    Pain Score 6     Pain Location Knee    Pain Orientation Right    Pain Descriptors / Indicators Aching;Nagging                [facility] PT Assessment - 12/17/20 0001       Assessment   Medical Diagnosis bilateral breast cancer    Referring Provider (PT) Cira Rue    Onset Date/Surgical Date 06/21/18      Prior Function   Level of Independence Independent with basic ADLs                           [facility] Adult PT Treatment/Exercise - 12/17/20 0001       Manual Therapy   Manual Therapy Manual Lymphatic Drainage (MLD);Edema management    Manual Lymphatic Drainage (MLD) MLD to short neck,5 breaths, left axillary and right axillary and Inguinal LN's left axilloinguinal pathway and left breast moving all towards left anterior inguinal pathway. Then to right sidleying for posterior interaxillary anastamosis.    Passive ROM PROM left shoulder  PT Long Term Goals - 12/17/20 1620       PT LONG TERM GOAL #1   Title Pt will have new compression bras and demonstrate ability to perform self MLD and use of compression to help manage her left breast lymphedema    Baseline Pt got new bras and is process of getting Flexitouch    Time 4    Period Weeks    Status On-going      PT LONG TERM GOAL #2   Title Pt will be independent in a home exercise program for shoulder ROM and strength as remedial exercise for lymphedema    Time 4    Period Weeks    Status On-going      PT LONG TERM GOAL #3   Title Pt will have a decrease in 2 cm when measured around widest part of chest and back    Baseline 147 cm    Time 4    Period Weeks    Status On-going              Pr has had four weeks conservative treatment with failure (compression, elevation, exercise)  One time trial and reason for failure of basic pump due to increase of proximal arm measurements.    Pt has a trial of basic pump on 12/10/20. The basic pump  caused proximal arm increases (40 cm pre, 41 cm post) which indicates a failure of the device.        Plan - 12/17/20 1613     Clinical Impression Statement Pt reports that she is getting benefit from MLD and noticed that she can breath better since she begain treatment 11/12/2020 She continues to wear compression bras daily as she has since 2020.  Despite this she continues to have hyperplasia in right medial breast with skin thickening and enlarged pores with congestion around the scars on lateral breast. She continues to have hyperpigmentation of the skin after radiation. Her left breast is visibly at least 25% larger than right with pitting edema visible after bra removal.  She also has swelling in lateral trunk and these symptoms have persisted for almost 2 years.  She needs to have Flexitouch compression pump at home to control these symptoms daily at home on her own.    Personal Factors and Comorbidities Comorbidity 3+    Comorbidities bilateral breast cancer and treatment, DM, HTN hyperlipidemia, obesity, knee arthritis, bilateral hand tremors    Examination-Activity Limitations Reach Overhead;Caring for Others;Hygiene/Grooming;Locomotion Level    Examination-Participation Restrictions Community Activity;Driving;Medication Management;Meal Prep;Cleaning;Laundry;Volunteer;Yard Work;Shop    Stability/Clinical Decision Making Stable/Uncomplicated    Rehab Potential Good    PT Frequency 2x / week    PT Duration 4 weeks    PT Treatment/Interventions Therapeutic activities;Therapeutic exercise;Patient/family education;Manual lymph drainage;Compression bandaging;Scar mobilization    PT Next Visit Plan Continue MLD until pt received Flexitouch ,  continue ROM,    PT Home Exercise Plan supine AA ROM flexion and stargazer, supine arm circles, self left breast MLD    Consulted and Agree with Plan of Care Patient           Recert sent for more visits today   Patient will benefit from skilled  therapeutic intervention in order to improve the following deficits and impairments:  Increased edema, Decreased strength, Decreased mobility, Decreased skin integrity, Impaired UE functional use, Decreased range of motion  Visit Diagnosis: Lymphedema, not elsewhere classified - Plan: PT plan of care cert/re-cert  Stiffness of left shoulder, not elsewhere  classified - Plan: PT plan of care cert/re-cert  Muscle weakness (generalized) - Plan: PT plan of care cert/re-cert     Problem List Patient Active Problem List   Diagnosis Date Noted   Genetic testing 11/13/2020   Family history of breast cancer    Family history of bladder cancer    Personal history of breast cancer    Malignant neoplasm of left breast in female, estrogen receptor negative ([place]) 10/23/2020   Malignant neoplasm of right breast in female, estrogen receptor positive ([place]) 10/07/2020   Depression 10/07/2020   Diabetes mellitus ([place]) 10/07/2020   Hypercholesterolemia 10/07/2020   Hypertension 10/07/2020   Tremor of both hands 10/07/2020   History of gout 09/26/2020   Donato Heinz. Owens Shark PT  [place] Levo 12/17/2020, 4:23 PM  [place], [place], [address] Phone: [phone]   Fax:  [phone]  Name: Shaquanda Graves MRN: [id number] Date of Birth: 04-Jan-1946

## 2021-01-08 ENCOUNTER — Other Ambulatory Visit: Payer: Self-pay

## 2021-01-08 ENCOUNTER — Ambulatory Visit: Payer: Medicare HMO | Admitting: Podiatry

## 2021-01-08 DIAGNOSIS — M79675 Pain in left toe(s): Secondary | ICD-10-CM | POA: Diagnosis not present

## 2021-01-08 DIAGNOSIS — B351 Tinea unguium: Secondary | ICD-10-CM | POA: Diagnosis not present

## 2021-01-08 DIAGNOSIS — E119 Type 2 diabetes mellitus without complications: Secondary | ICD-10-CM

## 2021-01-08 DIAGNOSIS — M79674 Pain in right toe(s): Secondary | ICD-10-CM | POA: Diagnosis not present

## 2021-01-12 ENCOUNTER — Encounter: Payer: Self-pay | Admitting: Podiatry

## 2021-01-12 NOTE — Progress Notes (Signed)
  Subjective:  Patient ID: Toni [place], female    DOB: 1945/10/23,  MRN: [id number]  Chief Complaint  Patient presents with   Nail Problem    Thick painful toenails, 3 month follow up, diabetes    75 y.o. female returns with the above complaint. History confirmed with patient.  Blood sugars under good control, she is doing well no new issues otherwise  Objective:  Physical Exam: warm, good capillary refill, no trophic changes or ulcerative lesions, normal DP and PT pulses, normal monofilament exam and normal sensory exam. Onychomycosis x10  Assessment:   1. Pain due to onychomycosis of toenails of both feet   2. Type 2 diabetes mellitus without complication, without long-term current use of insulin ([place])      Plan:  Patient was evaluated and treated and all questions answered.  Discussed the etiology and treatment options for the condition in detail with the patient. Educated patient on the topical and oral treatment options for mycotic nails. Recommended debridement of the nails today. Sharp and mechanical debridement performed of all painful and mycotic nails today. Nails debrided in length and thickness using a nail nipper to level of comfort. Discussed treatment options including appropriate shoe gear. Follow up as needed for painful nails.  Patient educated on diabetes. Discussed proper diabetic foot care and discussed risks and complications of disease. Educated patient in depth on reasons to return to the office immediately should he/she discover anything concerning or new on the feet. All questions answered. Discussed proper shoes as well.    Return in about 3 months (around 04/10/2021) for at risk diabetic foot care.

## 2021-04-14 ENCOUNTER — Ambulatory Visit: Payer: Medicare HMO | Admitting: Podiatry

## 2021-04-15 ENCOUNTER — Other Ambulatory Visit: Payer: Medicare HMO

## 2021-04-16 ENCOUNTER — Ambulatory Visit: Payer: Medicare HMO | Admitting: Podiatry

## 2021-04-16 ENCOUNTER — Other Ambulatory Visit: Payer: Self-pay

## 2021-04-16 DIAGNOSIS — E119 Type 2 diabetes mellitus without complications: Secondary | ICD-10-CM

## 2021-04-16 DIAGNOSIS — B351 Tinea unguium: Secondary | ICD-10-CM | POA: Diagnosis not present

## 2021-04-16 DIAGNOSIS — M79675 Pain in left toe(s): Secondary | ICD-10-CM

## 2021-04-16 DIAGNOSIS — M79674 Pain in right toe(s): Secondary | ICD-10-CM

## 2021-04-20 ENCOUNTER — Other Ambulatory Visit: Payer: Self-pay | Admitting: Nurse Practitioner

## 2021-04-20 ENCOUNTER — Ambulatory Visit
Admission: RE | Admit: 2021-04-20 | Discharge: 2021-04-20 | Disposition: A | Discharge status: Home / Self Care | Payer: Medicare HMO | Source: Ambulatory Visit | Attending: Nurse Practitioner | Admitting: Nurse Practitioner

## 2021-04-20 DIAGNOSIS — N6320 Unspecified lump in the left breast, unspecified quadrant: Secondary | ICD-10-CM

## 2021-04-20 DIAGNOSIS — C50911 Malignant neoplasm of unspecified site of right female breast: Secondary | ICD-10-CM

## 2021-04-20 NOTE — Progress Notes (Signed)
  Subjective:  Patient ID: Toni Stone, female    DOB: 1945/12/11,  MRN: [id number]  Chief Complaint  Patient presents with   Nail Problem    Thick painful toenails, 3 month follow up    75 y.o. female returns with the above complaint. History confirmed with patient.  Blood sugars under good control, she is doing well no new issues otherwise  Objective:  Physical Exam: warm, good capillary refill, no trophic changes or ulcerative lesions, normal DP and PT pulses, normal monofilament exam and normal sensory exam. Onychomycosis x10  Assessment:   1. Pain due to onychomycosis of toenails of both feet   2. Type 2 diabetes mellitus without complication, without long-term current use of insulin ([place])       Plan:  Patient was evaluated and treated and all questions answered.  Discussed the etiology and treatment options for the condition in detail with the patient. Educated patient on the topical and oral treatment options for mycotic nails. Recommended debridement of the nails today. Sharp and mechanical debridement performed of all painful and mycotic nails today. Nails debrided in length and thickness using a nail nipper to level of comfort. Discussed treatment options including appropriate shoe gear. Follow up as needed for painful nails.  Patient educated on diabetes. Discussed proper diabetic foot care and discussed risks and complications of disease. Educated patient in depth on reasons to return to the office immediately should he/she discover anything concerning or new on the feet. All questions answered. Discussed proper shoes as well.    Return in about 3 months (around 07/17/2021) for at risk diabetic foot care.

## 2021-04-21 HISTORY — PX: BREAST BIOPSY: SHX20

## 2021-04-23 ENCOUNTER — Other Ambulatory Visit: Payer: Self-pay

## 2021-04-23 ENCOUNTER — Telehealth: Payer: Self-pay | Admitting: Hematology

## 2021-04-23 ENCOUNTER — Telehealth: Payer: Self-pay

## 2021-04-23 NOTE — Telephone Encounter (Signed)
Rescheduled upcoming appointment per 11/3 staff message. Patient's daughter is aware of changes.

## 2021-04-23 NOTE — Telephone Encounter (Signed)
Spoke w/pt regarding appt scheduled w/Dr. Burr Medico on 04/24/2021.  Informed pt and pt's daughter that the pt is scheduled for a biopsy on 05/04/2021 and the F/U appt needs to be scheduled at least 2 to 3 days post the biopsy date to allow for biopsy results to be read.  Pt's daughter stated she can ONLY bring her mom for her follow-up appt on 05/06/2021 or 05/13/2021.  Sent a message to scheduling to reschedule the pt to see Dr. Burr Medico on 05/06/2021 for F/U appt.

## 2021-04-24 ENCOUNTER — Inpatient Hospital Stay: Payer: Medicare HMO

## 2021-04-24 ENCOUNTER — Inpatient Hospital Stay: Payer: Medicare HMO | Admitting: Hematology

## 2021-04-30 ENCOUNTER — Other Ambulatory Visit: Payer: Medicare HMO

## 2021-05-04 ENCOUNTER — Ambulatory Visit
Admission: RE | Admit: 2021-05-04 | Discharge: 2021-05-04 | Disposition: A | Discharge status: Home / Self Care | Payer: Medicare HMO | Source: Ambulatory Visit | Attending: Nurse Practitioner | Admitting: Nurse Practitioner

## 2021-05-04 ENCOUNTER — Other Ambulatory Visit: Payer: Self-pay | Admitting: Nurse Practitioner

## 2021-05-04 ENCOUNTER — Other Ambulatory Visit: Payer: Self-pay

## 2021-05-04 DIAGNOSIS — N6320 Unspecified lump in the left breast, unspecified quadrant: Secondary | ICD-10-CM

## 2021-05-05 ENCOUNTER — Ambulatory Visit
Admission: RE | Admit: 2021-05-05 | Discharge: 2021-05-05 | Disposition: A | Discharge status: Home / Self Care | Payer: Medicare HMO | Source: Ambulatory Visit | Attending: Nurse Practitioner | Admitting: Nurse Practitioner

## 2021-05-05 DIAGNOSIS — C50911 Malignant neoplasm of unspecified site of right female breast: Secondary | ICD-10-CM

## 2021-05-05 DIAGNOSIS — Z170 Estrogen receptor positive status [ER+]: Secondary | ICD-10-CM

## 2021-05-06 ENCOUNTER — Other Ambulatory Visit: Payer: Self-pay

## 2021-05-06 ENCOUNTER — Inpatient Hospital Stay: Payer: Medicare HMO | Admitting: Hematology

## 2021-05-06 ENCOUNTER — Inpatient Hospital Stay: Payer: Medicare HMO | Attending: Hematology

## 2021-05-06 VITALS — BP 158/77 | HR 69 | Temp 98.0°F | Resp 19 | Wt 254.0 lb

## 2021-05-06 DIAGNOSIS — E1122 Type 2 diabetes mellitus with diabetic chronic kidney disease: Secondary | ICD-10-CM | POA: Insufficient documentation

## 2021-05-06 DIAGNOSIS — Z171 Estrogen receptor negative status [ER-]: Secondary | ICD-10-CM

## 2021-05-06 DIAGNOSIS — Z803 Family history of malignant neoplasm of breast: Secondary | ICD-10-CM | POA: Insufficient documentation

## 2021-05-06 DIAGNOSIS — I129 Hypertensive chronic kidney disease with stage 1 through stage 4 chronic kidney disease, or unspecified chronic kidney disease: Secondary | ICD-10-CM | POA: Diagnosis not present

## 2021-05-06 DIAGNOSIS — Z8052 Family history of malignant neoplasm of bladder: Secondary | ICD-10-CM | POA: Diagnosis not present

## 2021-05-06 DIAGNOSIS — G8929 Other chronic pain: Secondary | ICD-10-CM | POA: Insufficient documentation

## 2021-05-06 DIAGNOSIS — C50912 Malignant neoplasm of unspecified site of left female breast: Secondary | ICD-10-CM | POA: Insufficient documentation

## 2021-05-06 DIAGNOSIS — C50911 Malignant neoplasm of unspecified site of right female breast: Secondary | ICD-10-CM

## 2021-05-06 DIAGNOSIS — R609 Edema, unspecified: Secondary | ICD-10-CM | POA: Diagnosis not present

## 2021-05-06 DIAGNOSIS — M25561 Pain in right knee: Secondary | ICD-10-CM | POA: Diagnosis not present

## 2021-05-06 DIAGNOSIS — N189 Chronic kidney disease, unspecified: Secondary | ICD-10-CM | POA: Insufficient documentation

## 2021-05-06 DIAGNOSIS — G250 Essential tremor: Secondary | ICD-10-CM | POA: Insufficient documentation

## 2021-05-06 DIAGNOSIS — M810 Age-related osteoporosis without current pathological fracture: Secondary | ICD-10-CM | POA: Diagnosis not present

## 2021-05-06 DIAGNOSIS — D649 Anemia, unspecified: Secondary | ICD-10-CM | POA: Insufficient documentation

## 2021-05-06 DIAGNOSIS — Z170 Estrogen receptor positive status [ER+]: Secondary | ICD-10-CM | POA: Diagnosis not present

## 2021-05-06 DIAGNOSIS — Z7984 Long term (current) use of oral hypoglycemic drugs: Secondary | ICD-10-CM | POA: Diagnosis not present

## 2021-05-06 LAB — CMP (CANCER CENTER ONLY)
ALT: 26 U/L (ref 0–44)
AST: 20 U/L (ref 15–41)
Albumin: 3.4 g/dL — ABNORMAL LOW (ref 3.5–5.0)
Alkaline Phosphatase: 114 U/L (ref 38–126)
Anion gap: 10 (ref 5–15)
BUN: 38 mg/dL — ABNORMAL HIGH (ref 8–23)
CO2: 29 mmol/L (ref 22–32)
Calcium: 9.6 mg/dL (ref 8.9–10.3)
Chloride: 105 mmol/L (ref 98–111)
Creatinine: 1.3 mg/dL — ABNORMAL HIGH (ref 0.44–1.00)
GFR, Estimated: 43 mL/min — ABNORMAL LOW (ref >60)
Glucose, Bld: 75 mg/dL (ref 70–99)
Potassium: 4.3 mmol/L (ref 3.5–5.1)
Sodium: 144 mmol/L (ref 135–145)
Total Bilirubin: <0.2 mg/dL — ABNORMAL LOW (ref 0.3–1.2)
Total Protein: 7.9 g/dL (ref 6.5–8.1)

## 2021-05-06 LAB — CBC WITH DIFFERENTIAL (CANCER CENTER ONLY)
Abs Immature Granulocytes: 0.01 10*3/uL (ref 0.00–0.07)
Basophils Absolute: 0 10*3/uL (ref 0.0–0.1)
Basophils Relative: 0 %
Eosinophils Absolute: 0.2 10*3/uL (ref 0.0–0.5)
Eosinophils Relative: 4 %
HCT: 34.5 % — ABNORMAL LOW (ref 36.0–46.0)
Hemoglobin: 11.2 g/dL — ABNORMAL LOW (ref 12.0–15.0)
Immature Granulocytes: 0 %
Lymphocytes Relative: 20 %
Lymphs Abs: 1.2 10*3/uL (ref 0.7–4.0)
MCH: 30.8 pg (ref 26.0–34.0)
MCHC: 32.5 g/dL (ref 30.0–36.0)
MCV: 94.8 fL (ref 80.0–100.0)
Monocytes Absolute: 0.4 10*3/uL (ref 0.1–1.0)
Monocytes Relative: 7 %
Neutro Abs: 4.2 10*3/uL (ref 1.7–7.7)
Neutrophils Relative %: 69 %
Platelet Count: 192 10*3/uL (ref 150–400)
RBC: 3.64 MIL/uL — ABNORMAL LOW (ref 3.87–5.11)
RDW: 13.9 % (ref 11.5–15.5)
WBC Count: 6 10*3/uL (ref 4.0–10.5)
nRBC: 0 % (ref 0.0–0.2)

## 2021-05-06 NOTE — Progress Notes (Signed)
Toni Stone   [phone] [phone]   Clinic Follow up Note   Patient Care Team: Berkley Harvey, NP as PCP - General (Nurse Practitioner)  Date of Service:  05/06/2021  CHIEF COMPLAINT: f/u of left breast cancer  CURRENT THERAPY:  Surveillance  ASSESSMENT & PLAN:  Toni Stone is a 75 y.o. female with   1.  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  Staging PET scan was negative for distant metastasis.  She completed adjuvant chemo (possibly AC-T) and radiation. Due to triple negative breast cancer, she would not benefit from antiestrogen therapy -CA 27-29 normal, last 10/27/20 -most recent MM on 04/20/21 showed a suspicious left breast lesion. Biopsy 05/04/21 was benign. -she previously underwent PT for left lymphedema. She still has some mild lymphedema on exam today. -Toni Stone appears stable, doing well, breast exam is otherwise benign. There is no clinical concern for breast cancer recurrence  2. Osteopenia -DEXA 05/05/21 showed osteopenia, borderline osteoporosis (T-score -2.4). -I recommended she take calcium and vit D. I briefly discussed possibly adding a bone medicine, but we will discuss further as needed.   3.  Malignant neoplasm right breast, stage 1A, pT1cN0M0, ER/PR positive, HER2 negative, grade 2 -Diagnosed 06/23/2004, s/p lumpectomy with axillary lymph node dissection, adjuvant chemo (per patient), radiation, and 5 years of antiestrogen therapy with anastrozole -Continue surveillance   4. Genetics -Given patient's history of bilateral breast cancer, and family history of sister with breast and bladder cancer, and mother with cancer, she qualifies for genetics -She underwent genetic testing on 10/27/20, results were negative   5. Chronic right knee  pain -Ambulates with a cane, would like to get a wheelchair. I advised her to talk to her PCP. -Denies any other new/worsening bone or joint pain   6. Essential tremors, DM, CKD, anemia, HTN, HL -Followed by PCP -HG A1c 7.8, BG 172, SCR 1.49, K5.6 on 09/26/20 -iron panel 10/27/20 was WNL      -previously seen by neurology due to tremors. She denies Parkinson's diagnosis.   7. Health maintenance, disease prevention -Colonoscopy in 2014 and EGD in 2015 in [place] to be her last, per patient -Continue follow-up with PCP    PLAN: -lab and f/u in 6 months   No problem-specific Assessment & Plan notes found for this encounter.   SUMMARY OF ONCOLOGIC HISTORY: Oncology History  Malignant neoplasm of right breast in female, estrogen receptor positive ([place])  06/23/2004 Cancer Staging   Staging form: Breast, AJCC 8th Edition - Pathologic stage from 06/23/2004: Stage IA (pT1c, pN0, cM0, G2, ER+, PR+, HER2-) - Signed by Alla Feeling, NP on 10/23/2020 Stage prefix: Initial diagnosis Histologic grading system: 3 grade system    10/07/2020 Initial Diagnosis   Malignant neoplasm of right breast in female, estrogen receptor positive ([place])    Genetic Testing   Negative genetic testing. No pathogenic variants identified on the [facility] Multi-Cancer Panel+RNA. The report date is 11/12/2020.  The Multi-Cancer Panel + RNA offered by [facility] includes sequencing and/or deletion duplication testing of the following 84 genes: AIP, ALK, APC, ATM, AXIN2,BAP1,  BARD1, BLM, BMPR1A, BRCA1, BRCA2, BRIP1, CASR, CDC73, CDH1, CDK4, CDKN1B, CDKN1C, CDKN2A (p14ARF), CDKN2A (p16INK4a), CEBPA, CHEK2, CTNNA1, DICER1, DIS3L2, EGFR (c.2369C>T, p.Thr790Met variant only), EPCAM (Deletion/duplication testing only), FH, FLCN, GATA2, GPC3, GREM1 (Promoter region deletion/duplication testing only), HOXB13 (  c.251G>A, p.Gly84Glu), HRAS, KIT, MAX, MEN1, MET, MITF (c.952G>A, p.Glu318Lys variant only), MLH1, MSH2, MSH3, MSH6, MUTYH, NBN, NF1,  NF2, NTHL1, PALB2, PDGFRA, PHOX2B, PMS2, POLD1, POLE, POT1, PRKAR1A, PTCH1, PTEN, RAD50, RAD51C, RAD51D, RB1, RECQL4, RET, RUNX1, SDHAF2, SDHA (sequence changes only), SDHB, SDHC, SDHD, SMAD4, SMARCA4, SMARCB1, SMARCE1, STK11, SUFU, TERC, TERT, TMEM127, TP53, TSC1, TSC2, VHL, WRN and WT1.   Malignant neoplasm of left breast in female, estrogen receptor negative ([place])  08/23/2018 Cancer Staging   Staging form: Breast, AJCC 8th Edition - Pathologic stage from 08/23/2018: Stage IIA (pT2, pN0, cM0, G3, ER-, PR-, HER2-) - Signed by Alla Feeling, NP on 10/23/2020 Stage prefix: Initial diagnosis Histologic grading system: 3 grade system    10/23/2020 Initial Diagnosis   Malignant neoplasm of left breast in female, estrogen receptor negative ([place])    Genetic Testing   Negative genetic testing. No pathogenic variants identified on the [facility] Multi-Cancer Panel+RNA. The report date is 11/12/2020.  The Multi-Cancer Panel + RNA offered by [facility] includes sequencing and/or deletion duplication testing of the following 84 genes: AIP, ALK, APC, ATM, AXIN2,BAP1,  BARD1, BLM, BMPR1A, BRCA1, BRCA2, BRIP1, CASR, CDC73, CDH1, CDK4, CDKN1B, CDKN1C, CDKN2A (p14ARF), CDKN2A (p16INK4a), CEBPA, CHEK2, CTNNA1, DICER1, DIS3L2, EGFR (c.2369C>T, p.Thr790Met variant only), EPCAM (Deletion/duplication testing only), FH, FLCN, GATA2, GPC3, GREM1 (Promoter region deletion/duplication testing only), HOXB13 (c.251G>A, p.Gly84Glu), HRAS, KIT, MAX, MEN1, MET, MITF (c.952G>A, p.Glu318Lys variant only), MLH1, MSH2, MSH3, MSH6, MUTYH, NBN, NF1, NF2, NTHL1, PALB2, PDGFRA, PHOX2B, PMS2, POLD1, POLE, POT1, PRKAR1A, PTCH1, PTEN, RAD50, RAD51C, RAD51D, RB1, RECQL4, RET, RUNX1, SDHAF2, SDHA (sequence changes only), SDHB, SDHC, SDHD, SMAD4, SMARCA4, SMARCB1, SMARCE1, STK11, SUFU, TERC, TERT, TMEM127, TP53, TSC1, TSC2, VHL, WRN and WT1.      INTERVAL HISTORY:  Toni Stone is here for a follow up of breast cancer. She was last seen by me on  10/23/20 in consultation with NP Lacie. She presents to the clinic accompanied by her daughter. She reports doing well overall.   All other systems were reviewed with the patient and are negative.  MEDICAL HISTORY:  Past Medical History:  Diagnosis Date   Anemia    Breast cancer ([place])    Chronic kidney disease    Diabetes mellitus without complication ([facility])    Family history of bladder cancer    Family history of breast cancer    Hypertension    Neuromuscular disorder ([place])    Tremor   Personal history of breast cancer     SURGICAL HISTORY: Past Surgical History:  Procedure Laterality Date   ABDOMINAL HYSTERECTOMY     Age 86s, for ectopic pregnancy   BREAST LUMPECTOMY Right 06/23/2004   pT1cN0M0 ER/PR + HER2 - stage IA   BREAST LUMPECTOMY Left 08/23/2018   pT2N0M0 ER/PR/HER2 neg stage IIA    I have reviewed the social history and family history with the patient and they are unchanged from previous note.  ALLERGIES:  is allergic to metformin.  MEDICATIONS:  Current Outpatient Medications  Medication Sig Dispense Refill   Blood Glucose Monitoring Suppl (GLUCOCOM BLOOD GLUCOSE MONITOR) DEVI 1 each by Misc.(Non-Drug; Combo Route) route daily. May substitute for insurance     febuxostat (ULORIC) 40 MG tablet Take 1 tablet by mouth daily.     glipiZIDE (GLUCOTROL) 10 MG tablet Take 1 tablet by mouth daily.     glucose blood ([employer] BLOOD GLUCOSE TEST) test strip 1 each by Other route daily.     insulin glargine (LANTUS) 100 UNIT/ML injection Inject into  the skin.     pregabalin (LYRICA) 75 MG capsule Take by mouth.     primidone (MYSOLINE) 250 MG tablet Take by mouth.     propranolol (INNOPRAN XL) 120 MG 24 hr capsule Take 1 capsule by mouth daily.     simvastatin (ZOCOR) 20 MG tablet Take by mouth.     sitaGLIPtin (JANUVIA) 50 MG tablet Take 1 tablet by mouth daily.     triamterene-hydrochlorothiazide (MAXZIDE-25) 37.5-25 MG tablet Take 1 tablet by mouth daily.     No  current facility-administered medications for this visit.    PHYSICAL EXAMINATION: ECOG PERFORMANCE STATUS: 2 - Symptomatic, <50% confined to bed  Vitals:   05/06/21 1230  BP: (!) 158/77  Pulse: 69  Resp: 19  Temp: 98 F (36.7 C)  SpO2: 98%   Wt Readings from Last 3 Encounters:  05/06/21 254 lb (115.2 kg)  10/23/20 263 lb 6.4 oz (119.5 kg)     GENERAL:alert, no distress and comfortable SKIN: skin color, texture, turgor are normal, no rashes or significant lesions EYES: normal, Conjunctiva are pink and non-injected, sclera clear  NECK: supple, thyroid normal size, non-tender, without nodularity LYMPH:  no palpable lymphadenopathy in the cervical, axillary  LUNGS: clear to auscultation and percussion with normal breathing effort HEART: regular rate & rhythm and no murmurs and no lower extremity edema ABDOMEN:abdomen soft, non-tender and normal bowel sounds Musculoskeletal:no cyanosis of digits and no clubbing  NEURO: alert & oriented x 3 with fluent speech, no focal motor/sensory deficits BREAST: 1 x 1.5 cm nodule at 1 o'clock in left breast, at prior lumpectomy site. No palpable mass or adenopathy bilaterally.  LABORATORY DATA:  I have reviewed the data as listed CBC Latest Ref Rng & Units 05/06/2021 10/27/2020  WBC 4.0 - 10.5 K/uL 6.0 5.1  Hemoglobin 12.0 - 15.0 g/dL 11.2(L) 11.1(L)  Hematocrit 36.0 - 46.0 % 34.5(L) 34.8(L)  Platelets 150 - 400 K/uL 192 181     CMP Latest Ref Rng & Units 05/06/2021 10/27/2020  Glucose 70 - 99 mg/dL 75 137(H)  BUN 8 - 23 mg/dL 38(H) 42(H)  Creatinine 0.44 - 1.00 mg/dL 1.30(H) 1.54(H)  Sodium 135 - 145 mmol/L 144 146(H)  Potassium 3.5 - 5.1 mmol/L 4.3 4.5  Chloride 98 - 111 mmol/L 105 104  CO2 22 - 32 mmol/L 29 30  Calcium 8.9 - 10.3 mg/dL 9.6 9.3  Total Protein 6.5 - 8.1 g/dL 7.9 7.9  Total Bilirubin 0.3 - 1.2 mg/dL <0.2(L) <0.2(L)  Alkaline Phos 38 - 126 U/L 114 119  AST 15 - 41 U/L 20 29  ALT 0 - 44 U/L 26 38      RADIOGRAPHIC  STUDIES: I have personally reviewed the radiological images as listed and agreed with the findings in the report. DG Bone Density  Result Date: 05/05/2021 EXAM: DUAL X-RAY ABSORPTIOMETRY (DXA) FOR BONE MINERAL DENSITY IMPRESSION: Referring Physician:  Alla Feeling Your patient completed a bone mineral density test using GE Lunar iDXA system (analysis version: 16). Technologist: [place] PATIENT: Name: Toni Stone, Toni Stone Patient ID: [id number] Birth Date: 1945-09-21 Height: 61.5 in. Sex: Female Measured: 05/05/2021 Weight: 251.4 lbs. Indications: Advanced Age, Bilateral Ovariectomy (65.51), Breast Cancer History, Estrogen Deficient, Hysterectomy, Insulin for Diabetes, [place], Postmenopausal, Secondary Osteoporosis Fractures: Treatments: None ASSESSMENT: The BMD measured at Femur Neck Right is 0.702 g/cm2 with a T-score of -2.4. This patient is considered osteopenic/low bone mass according to [facility]) criteria. The quality of the exam is good. The lumbar spine  was excluded due to degenerative changes. Site Region Measured Date Measured Age YA BMD Significant CHANGE T-score DualFemur Neck Right 05/05/2021 73.2 -2.4 0.702 g/cm2 DualFemur Total Mean 05/05/2021 73.2 -1.9 0.765 g/cm2 Right Forearm Radius 33% 05/05/2021 73.2 -0.9 0.810 g/cm2 World Health Organization [facility]) criteria for post-menopausal, Caucasian Women: Normal       T-score at or above -1 SD Osteopenia   T-score between -1 and -2.5 SD Osteoporosis T-score at or below -2.5 SD RECOMMENDATION: 1. All patients should optimize calcium and vitamin D intake. 2. Consider FDA-approved medical therapies in postmenopausal women and men aged 75 years and older, based on the following: a. A hip or vertebral (clinical or morphometric) fracture. b. T-score = -2.5 at the femoral neck or spine after appropriate evaluation to exclude secondary causes. c. Low bone mass (T-score between -1.0 and -2.5 at the femoral neck or spine) and a 10-year probability of a  hip fracture = 3% or a 10-year probability of a major osteoporosis-related fracture = 20% based on the [place]-adapted WHO algorithm. d. Clinician judgment and/or patient preferences may indicate treatment for people with 10-year fracture probabilities above or below these levels. FOLLOW-UP: Patients with diagnosis of osteoporosis or at high risk for fracture should have regular bone mineral density tests.? Patients eligible for Medicare are allowed routine testing every 2 years.? The testing frequency can be increased to one year for patients who have rapidly progressing disease, are receiving or discontinuing medical therapy to restore bone mass, or have additional risk factors. I have reviewed this study and agree with the findings. [facility] FRAX* 10-year Probability of Fracture Based on femoral neck BMD: DualFemur (Right) Major Osteoporotic Fracture: 5.8% Hip Fracture:                1.4% Population:                  [place] ([place]) Risk Factors:                Secondary Osteoporosis *FRAX is a [place] of [employer] for Metabolic Bone Disease, a [place] (WHO) [facility]. ASSESSMENT: The probability of a major osteoporotic fracture is 5.8% within the next ten years. The probability of a hip fracture is 1.4% within the next ten years. Electronically Signed   By: Elmer Picker M.D.   On: 05/05/2021 11:53      No orders of the defined types were placed in this encounter.  All questions were answered. The patient knows to call the clinic with any problems, questions or concerns. No barriers to learning was detected. The total time spent in the appointment was 30 minutes.     Truitt Merle, MD 05/06/2021   I, Wilburn Mylar, am acting as scribe for Truitt Merle, MD.   I have reviewed the above documentation for accuracy and completeness, and I agree with the above.

## 2021-05-07 LAB — CANCER ANTIGEN 27.29: CA 27.29: 22.1 U/mL (ref 0.0–38.6)

## 2021-07-14 ENCOUNTER — Ambulatory Visit: Payer: Medicare HMO | Admitting: Podiatry

## 2021-07-17 ENCOUNTER — Ambulatory Visit: Payer: Medicare HMO | Admitting: Podiatry

## 2021-07-23 ENCOUNTER — Ambulatory Visit: Payer: Medicare HMO | Admitting: Podiatry

## 2021-07-23 ENCOUNTER — Other Ambulatory Visit: Payer: Self-pay

## 2021-07-23 ENCOUNTER — Encounter: Payer: Self-pay | Admitting: Podiatry

## 2021-07-23 DIAGNOSIS — E119 Type 2 diabetes mellitus without complications: Secondary | ICD-10-CM

## 2021-07-23 DIAGNOSIS — M79674 Pain in right toe(s): Secondary | ICD-10-CM

## 2021-07-23 DIAGNOSIS — M79675 Pain in left toe(s): Secondary | ICD-10-CM

## 2021-07-23 DIAGNOSIS — B351 Tinea unguium: Secondary | ICD-10-CM

## 2021-07-23 NOTE — Progress Notes (Signed)
°  Subjective:  Patient ID: Toni Stone, female    DOB: [birth year],  MRN: [id number]  Chief Complaint  Patient presents with   Nail Problem    Thick painful toenails, 3 month follow up    76 y.o. female returns with the above complaint. History confirmed with patient.  Blood sugars under good control, she is doing well no new issues otherwise  Objective:  Physical Exam: warm, good capillary refill, no trophic changes or ulcerative lesions, normal DP and PT pulses, normal monofilament exam and normal sensory exam. Onychomycosis x10  Assessment:   1. Pain due to onychomycosis of toenails of both feet   2. Type 2 diabetes mellitus without complication, without long-term current use of insulin ([place])        Plan:  Patient was evaluated and treated and all questions answered.  Discussed the etiology and treatment options for the condition in detail with the patient. Educated patient on the topical and oral treatment options for mycotic nails. Recommended debridement of the nails today. Sharp and mechanical debridement performed of all painful and mycotic nails today. Nails debrided in length and thickness using a nail nipper to level of comfort. Discussed treatment options including appropriate shoe gear. Follow up as needed for painful nails.  Patient educated on diabetes. Discussed proper diabetic foot care and discussed risks and complications of disease. Educated patient in depth on reasons to return to the office immediately should he/she discover anything concerning or new on the feet. All questions answered. Discussed proper shoes as well.    Return in about 3 months (around 10/20/2021) for diabetic nail trim.

## 2021-08-04 ENCOUNTER — Ambulatory Visit: Payer: Medicare HMO | Attending: Nurse Practitioner | Admitting: Physical Therapy

## 2021-08-04 ENCOUNTER — Other Ambulatory Visit: Payer: Self-pay

## 2021-08-04 ENCOUNTER — Encounter: Payer: Self-pay | Admitting: Physical Therapy

## 2021-08-04 DIAGNOSIS — G8929 Other chronic pain: Secondary | ICD-10-CM | POA: Insufficient documentation

## 2021-08-04 DIAGNOSIS — M6281 Muscle weakness (generalized): Secondary | ICD-10-CM | POA: Insufficient documentation

## 2021-08-04 DIAGNOSIS — M25562 Pain in left knee: Secondary | ICD-10-CM | POA: Diagnosis present

## 2021-08-04 DIAGNOSIS — R262 Difficulty in walking, not elsewhere classified: Secondary | ICD-10-CM | POA: Diagnosis present

## 2021-08-04 DIAGNOSIS — R2681 Unsteadiness on feet: Secondary | ICD-10-CM | POA: Diagnosis present

## 2021-08-04 NOTE — Patient Instructions (Signed)
Access Code: [email or web address] URL: [email or web address] Date: 08/04/2021 Prepared by: Ethel Rana  Exercises Seated Long Arc Quad - 1 x daily - 7 x weekly - 2 sets - 10 reps Seated Hip Abduction - 1 x daily - 7 x weekly - 2 sets - 10 reps Seated Heel Toe Raises - 1 x daily - 7 x weekly - 2 sets - 10 reps Seated Hamstring Stretch - 1 x daily - 7 x weekly - 3 sets - 10 hold

## 2021-08-04 NOTE — Therapy (Signed)
[place] [place]. [place], [place], [address] Phone: [phone]   Fax:  [phone]  Physical Therapy Evaluation  Patient Details  Name: Toni Stone MRN: [id number] Date of Birth: January 01, 1946 Referring Provider (PT): Eldridge Abrahams   Encounter Date: 08/04/2021   PT End of Session - 08/04/21 1214     Visit Number 1    Number of Visits 20    Date for PT Re-Evaluation 10/13/21    PT Start Time 1100    PT Stop Time 1146    PT Time Calculation (min) 46 min    Activity Tolerance Patient limited by pain;Patient tolerated treatment well    Behavior During Therapy [facility] for tasks assessed/performed             Past Medical History:  Diagnosis Date   Anemia    Breast cancer ([place])    Chronic kidney disease    Diabetes mellitus without complication ([place])    Family history of bladder cancer    Family history of breast cancer    Hypertension    Neuromuscular disorder ([place])    Tremor   Personal history of breast cancer     Past Surgical History:  Procedure Laterality Date   ABDOMINAL HYSTERECTOMY     Age 31s, for ectopic pregnancy   BREAST LUMPECTOMY Right 06/23/2004   pT1cN0M0 ER/PR + HER2 - stage IA   BREAST LUMPECTOMY Left 08/23/2018   pT2N0M0 ER/PR/HER2 neg stage IIA    There were no vitals filed for this visit.    Subjective Assessment - 08/04/21 1104     Subjective Patient reports B leg weakness, R knee hurt initially, but she now also has L knee pain. She was told her pain is due to arthritis. She started using a cane over a year ago.    Pertinent History right breast cancer in 2007 with chemo, lumpectomy and radiation and arimedex . left breast cancer 06/2018 lumpectomy with post op swelling that opened up and drained , chemo and radiation,  Pt was treated for this in [place] . She moved to [facility] in Nov. 2021 Past history includes tremors of her hands left > right, IDDM, HTN, sometimes her right knee  will "go out " her so she uses a quad cane    How long can you sit comfortably? No pain in sitting.    Patient Stated Goals Be able to walk better and farther.    Currently in Pain? Yes    Pain Score 6     Pain Location Knee    Pain Orientation Right;Left    Pain Descriptors / Indicators Aching    Pain Type Chronic pain    Pain Onset More than a month ago    Pain Frequency Constant    Aggravating Factors  Standing up, has to straighten the knee to pop it before standing.    Pain Relieving Factors quad cane    Effect of Pain on Daily Activities cannot get out, can't visit stores iwthout power carts.                [facility] PT Assessment - 08/04/21 0001       Assessment   Medical Diagnosis B leg pain and weakness.    Referring Provider (PT) Eldridge Abrahams      Balance Screen   Has the patient fallen in the past 6 months No      [place] residence  Living Arrangements Children    Available Help at Discharge Family    Type of [place] to enter    Entrance Stairs-Number of Steps 1    Entrance Stairs-Rails None    Home Layout One level    Home Equipment Shower seat;Cane - quad      Prior Function   Level of Independence Independent    Leisure Get back to [employer]      Cognition   Overall Cognitive Status Within Functional Limits for tasks assessed      Coordination   Gross Motor Movements are Fluid and Coordinated No      Posture/Postural Control   Posture/Postural Control No significant limitations      ROM / Strength   AROM / PROM / Strength AROM;Strength      AROM   AROM Assessment Site Hip;Knee    Right/Left Hip Right;Left    Right Hip Flexion 110    Right Hip External Rotation  25    Right Hip Internal Rotation  20    Left Hip Flexion 110    Left Hip External Rotation  30    Left Hip Internal Rotation  25    Right/Left Knee Right;Left    Right Knee Extension 0    Right Knee Flexion  105    Left Knee Extension -3    Left Knee Flexion 110      Strength   Strength Assessment Site Hip;Knee;Ankle    Right/Left Hip Right;Left    Right Hip Flexion 3+/5    Right Hip Extension 3-/5    Right Hip ABduction 3+/5    Left Hip Flexion 3+/5    Left Hip Extension 3/5    Left Hip ABduction 3+/5    Right/Left Knee Right;Left    Right Knee Flexion 3+/5    Right Knee Extension 3/5    Left Knee Flexion 3+/5    Left Knee Extension 3+/5    Right/Left Ankle Right;Left    Right Ankle Dorsiflexion 3+/5    Right Ankle Plantar Flexion 3/5    Right Ankle Eversion 3+/5    Left Ankle Dorsiflexion 3/5      Flexibility   Soft Tissue Assessment /Muscle Length yes    Hamstrings SLR- 45 R, 55L      Palpation   Patella mobility R limite din all directions    Palpation comment R medial and lateral joint lines TTP as are patella, patellar tendon      Transfers   Five time sit to stand comments  31.48 from elevated mat, used BUE. Had great difficulty rising from standing height chair.      Ambulation/Gait   Ambulation/Gait Yes    Ambulation/Gait Assistance 6: Modified independent (Device/Increase time)    Ambulation Distance (Feet) 80 Feet    Assistive device Small based quad cane    Gait Pattern Step-through pattern;Decreased step length - right;Decreased step length - left;Decreased stance time - right;Decreased stance time - left;Decreased stride length;Lateral hip instability;Lateral trunk lean to right;Lateral trunk lean to left;Wide base of support    Ambulation Surface Level;Indoor    Gait velocity slow and labored, unsteady                        Objective measurements completed on examination: See above findings.                PT Education - 08/04/21 1213  Education Details Initial HEP, POC.    Person(s) Educated Patient    Methods Explanation;Demonstration;Handout    Comprehension Returned demonstration;Verbalized understanding               PT Short Term Goals - 08/04/21 1222       PT SHORT TERM GOAL #1   Title Patietn will be I iwth basic HEP    Time 4    Period Weeks    Status New    Target Date 09/01/21               PT Long Term Goals - 08/04/21 1223       PT LONG TERM GOAL #1   Title I with final HEP    Time 10    Period Weeks    Status New    Target Date 10/13/21      PT LONG TERM GOAL #2   Title Increase BLE strength to at least 4/5    Baseline (3-)-3/5    Time 10    Period Weeks    Status New    Target Date 10/13/21      PT LONG TERM GOAL #3   Title Perform 5 x STS in < 20 sec to demosntrate improved strength and balance.    Baseline 31    Time 10    Period Weeks    Status New    Target Date 10/13/21      PT LONG TERM GOAL #4   Title Patient will ambulate at least 200' with LRAD with no C/O pain, demosntrating stable, efficient gait.    Baseline 80', SBQC, very slow and unsteady.                    Plan - 08/04/21 1215     Clinical Impression Statement Patient presents iwth BLE pain and weakness. She was seen 8 months ago after breast cancer for lymphedema in LUE. She reports that her R knee started to hurt about a year ago and the L knee soon followed. She has never received therapy for the pain. her Dr calls the R knee pain arthritic pain. She demonstrates weakness in BLE, mildly limited ROM and muscle tightness, possibly due to lack of activity recently. Her balance and functional mobility are limited, iwth increased fall risk, and increaed risk of further decline. Her daughter is requesting referral for power W/C which is appropriate as patient has become very cut off from teh worl due to her limited mobility. She    Personal Factors and Comorbidities Comorbidity 3+    Comorbidities bilateral breast cancer and treatment, DM, HTN hyperlipidemia, obesity, knee arthritis, bilateral hand tremors    Examination-Activity Limitations Locomotion Level;Transfers;Bed  Mobility;Carry;Squat;Stairs;Stand;Lift    Examination-Participation Restrictions Church;Meal Prep;Cleaning;Community Activity;Driving    Stability/Clinical Decision Making Stable/Uncomplicated    Clinical Decision Making Moderate    Rehab Potential Good    PT Frequency 2x / week    PT Duration Other (comment)   10w   PT Treatment/Interventions Therapeutic activities;Therapeutic exercise;Patient/family education;ADLs/Self Care Home Management;Iontophoresis 50m/ml Dexamethasone;Gait training;Stair training;Moist Heat;Ultrasound;Electrical Stimulation;Cryotherapy;DME Instruction;Balance training;Neuromuscular re-education;Manual techniques;Energy conservation;Dry needling;Passive range of motion;Taping    PT Next Visit Plan OT to screeen for treatment fo R hand, introduce RW, educate patient to safe use, update HEP.    PT Home Exercise Plan [email or web address]    Recommended Other Services OT screen    Consulted and Agree with Plan of Care Patient  Patient will benefit from skilled therapeutic intervention in order to improve the following deficits and impairments:  Increased edema, Decreased strength, Decreased mobility, Decreased range of motion, Abnormal gait, Decreased coordination, Difficulty walking, Improper body mechanics, Impaired flexibility, Decreased balance, Pain  Visit Diagnosis: Muscle weakness (generalized)  Difficulty in walking, not elsewhere classified  Unsteadiness on feet  Chronic pain of left knee  Left knee pain, unspecified chronicity     Problem List Patient Active Problem List   Diagnosis Date Noted   Genetic testing 11/13/2020   Family history of breast cancer    Family history of bladder cancer    Personal history of breast cancer    Malignant neoplasm of left breast in female, estrogen receptor negative ([place]) 10/23/2020   Malignant neoplasm of right breast in female, estrogen receptor positive ([place]) 10/07/2020   Depression 10/07/2020   Diabetes  mellitus ([place]) 10/07/2020   Hypercholesterolemia 10/07/2020   Hypertension 10/07/2020   Tremor of both hands 10/07/2020   History of gout 09/26/2020    Marcelina Morel, DPT 08/04/2021, 12:28 PM  [place]. [place], [place], [address] Phone: [phone]   Fax:  [phone]  Name: Halie Gass MRN: [id number] Date of Birth: 1946/03/24

## 2021-08-06 ENCOUNTER — Ambulatory Visit: Payer: Medicare HMO | Admitting: Physical Therapy

## 2021-08-06 ENCOUNTER — Other Ambulatory Visit: Payer: Self-pay

## 2021-08-06 ENCOUNTER — Encounter: Payer: Self-pay | Admitting: Physical Therapy

## 2021-08-06 DIAGNOSIS — R262 Difficulty in walking, not elsewhere classified: Secondary | ICD-10-CM

## 2021-08-06 DIAGNOSIS — M6281 Muscle weakness (generalized): Secondary | ICD-10-CM

## 2021-08-06 DIAGNOSIS — M25562 Pain in left knee: Secondary | ICD-10-CM

## 2021-08-06 DIAGNOSIS — R2681 Unsteadiness on feet: Secondary | ICD-10-CM

## 2021-08-06 DIAGNOSIS — G8929 Other chronic pain: Secondary | ICD-10-CM

## 2021-08-06 NOTE — Therapy (Signed)
[place]. [place], [place], [address] Phone: [phone]   Fax:  [phone]  Physical Therapy Treatment  Patient Details  Name: Toni Stone MRN: [id number] Date of Birth: 1945/12/21 Referring Provider (PT): Eldridge Abrahams   Encounter Date: 08/06/2021   PT End of Session - 08/06/21 1505     Visit Number 2    Date for PT Re-Evaluation 10/13/21    PT Start Time 0071    PT Stop Time 1505    PT Time Calculation (min) 40 min    Activity Tolerance Patient tolerated treatment well    Behavior During Therapy [facility] for tasks assessed/performed             Past Medical History:  Diagnosis Date   Anemia    Breast cancer ([place])    Chronic kidney disease    Diabetes mellitus without complication ([place])    Family history of bladder cancer    Family history of breast cancer    Hypertension    Neuromuscular disorder ([place])    Tremor   Personal history of breast cancer     Past Surgical History:  Procedure Laterality Date   ABDOMINAL HYSTERECTOMY     Age 67s, for ectopic pregnancy   BREAST LUMPECTOMY Right 06/23/2004   pT1cN0M0 ER/PR + HER2 - stage IA   BREAST LUMPECTOMY Left 08/23/2018   pT2N0M0 ER/PR/HER2 neg stage IIA    There were no vitals filed for this visit.   Subjective Assessment - 08/06/21 1425     Subjective "OK"    Currently in Pain? Yes    Pain Score 5     Pain Location Knee    Pain Orientation Left                               [facility] Adult PT Treatment/Exercise - 08/06/21 0001       High Level Balance   High Level Balance Activities Side stepping      Exercises   Exercises Knee/Hip      Knee/Hip Exercises: Aerobic   Nustep L3 x 5 min      Knee/Hip Exercises: Seated   Long Arc Quad Strengthening;Both;2 sets;10 reps    Ball Squeeze 2x10    Marching Both;2 sets;10 reps    [employer] 2 lbs.    Hamstring Curl Both;2 sets;10 reps    Hamstring Limitations red  Tband    Sit to Sand 2 sets;5 reps;without UE support;with UE support                       PT Short Term Goals - 08/04/21 1222       PT SHORT TERM GOAL #1   Title Patietn will be I iwth basic HEP    Time 4    Period Weeks    Status New    Target Date 09/01/21               PT Long Term Goals - 08/04/21 1223       PT LONG TERM GOAL #1   Title I with final HEP    Time 10    Period Weeks    Status New    Target Date 10/13/21      PT LONG TERM GOAL #2   Title Increase BLE strength to at least 4/5    Baseline (3-)-3/5    Time 10  Period Weeks    Status New    Target Date 10/13/21      PT LONG TERM GOAL #3   Title Perform 5 x STS in < 20 sec to demosntrate improved strength and balance.    Baseline 31    Time 10    Period Weeks    Status New    Target Date 10/13/21      PT LONG TERM GOAL #4   Title Patient will ambulate at least 200' with LRAD with no C/O pain, demosntrating stable, efficient gait.    Baseline 80', SBQC, very slow and unsteady.                   Plan - 08/06/21 1506     Clinical Impression Statement PT enters clinic feeling fine with some L knee pain. She did well overall with a progression to light Le strengthening interventions. Decrease L knee TKE noted compared to R. Come muscle fatigue reported from hamstring curls. Mat table elevated some to complete sit to stands without UE. Some initial instability with side steps.    Personal Factors and Comorbidities Comorbidity 3+    Comorbidities bilateral breast cancer and treatment, DM, HTN hyperlipidemia, obesity, knee arthritis, bilateral hand tremors    Examination-Activity Limitations Locomotion Level;Transfers;Bed Mobility;Carry;Squat;Stairs;Stand;Lift    Examination-Participation Restrictions Church;Meal Prep;Cleaning;Community Activity;Driving    Stability/Clinical Decision Making Stable/Uncomplicated    Rehab Potential Good    PT Frequency 2x / week    PT  Treatment/Interventions Therapeutic activities;Therapeutic exercise;Patient/family education;ADLs/Self Care Home Management;Iontophoresis 71m/ml Dexamethasone;Gait training;Stair training;Moist Heat;Ultrasound;Electrical Stimulation;Cryotherapy;DME Instruction;Balance training;Neuromuscular re-education;Manual techniques;Energy conservation;Dry needling;Passive range of motion;Taping    PT Next Visit Plan OT to screeen for treatment fo R hand, introduce RW, educate patient to safe use, update HEP.             Patient will benefit from skilled therapeutic intervention in order to improve the following deficits and impairments:  Increased edema, Decreased strength, Decreased mobility, Decreased range of motion, Abnormal gait, Decreased coordination, Difficulty walking, Improper body mechanics, Impaired flexibility, Decreased balance, Pain  Visit Diagnosis: Difficulty in walking, not elsewhere classified  Unsteadiness on feet  Chronic pain of left knee  Left knee pain, unspecified chronicity  Muscle weakness (generalized)     Problem List Patient Active Problem List   Diagnosis Date Noted   Genetic testing 11/13/2020   Family history of breast cancer    Family history of bladder cancer    Personal history of breast cancer    Malignant neoplasm of left breast in female, estrogen receptor negative ([place] 10/23/2020   Malignant neoplasm of right breast in female, estrogen receptor positive ([place] 10/07/2020   Depression 10/07/2020   Diabetes mellitus ([place] 10/07/2020   Hypercholesterolemia 10/07/2020   Hypertension 10/07/2020   Tremor of both hands 10/07/2020   History of gout 09/26/2020    [name] 08/06/2021, 3:08 PM  [place] [address]Phone: [phone]  Fax:  [phone] Name: Toni PierronMRN: [id number] of Birth: [birth year]

## 2021-08-10 ENCOUNTER — Other Ambulatory Visit: Payer: Self-pay

## 2021-08-10 ENCOUNTER — Ambulatory Visit: Payer: Medicare HMO | Admitting: Physical Therapy

## 2021-08-10 ENCOUNTER — Encounter: Payer: Self-pay | Admitting: Physical Therapy

## 2021-08-10 DIAGNOSIS — G8929 Other chronic pain: Secondary | ICD-10-CM

## 2021-08-10 DIAGNOSIS — R262 Difficulty in walking, not elsewhere classified: Secondary | ICD-10-CM

## 2021-08-10 DIAGNOSIS — M6281 Muscle weakness (generalized): Secondary | ICD-10-CM

## 2021-08-10 DIAGNOSIS — M25562 Pain in left knee: Secondary | ICD-10-CM

## 2021-08-10 DIAGNOSIS — R2681 Unsteadiness on feet: Secondary | ICD-10-CM

## 2021-08-10 NOTE — Therapy (Signed)
[place]oni Stone, [place], [address] Phone: [phone]   Fax:  [phone]  Physical Therapy Treatment  Patient Details  Name: Toni Stone MRN: [id number] Date of Birth: [birth year] Referring Provider (PT): Toni Stone   Encounter Date: 08/10/2021   PT End of Session - 08/10/21 1008     Visit Number 3    Number of Visits 20    Date for PT Re-Evaluation 10/13/21    PT Start Time 0930    PT Stop Time 5183    PT Time Calculation (min) 44 min    Activity Tolerance Patient tolerated treatment well    Behavior During Therapy [facility] for tasks assessed/performed             Past Medical History:  Diagnosis Date   Anemia    Breast cancer ([place])    Chronic kidney disease    Diabetes mellitus without complication ([place])    Family history of bladder cancer    Family history of breast cancer    Hypertension    Neuromuscular disorder ([place])    Tremor   Personal history of breast cancer     Past Surgical History:  Procedure Laterality Date   ABDOMINAL HYSTERECTOMY     Age 30s, for ectopic pregnancy   BREAST LUMPECTOMY Right 06/23/2004   pT1cN0M0 ER/PR + HER2 - stage IA   BREAST LUMPECTOMY Left 08/23/2018   pT2N0M0 ER/PR/HER2 neg stage IIA    There were no vitals filed for this visit.   Subjective Assessment - 08/10/21 0936     Subjective "OK"    Currently in Pain? No/denies    Pain Location Knee    Pain Orientation Right    Pain Descriptors / Indicators Tightness                               [facility] Adult PT Treatment/Exercise - 08/10/21 0001       Knee/Hip Exercises: Aerobic   Nustep L3 x 6 min      Knee/Hip Exercises: Standing   Other Standing Knee Exercises Standing marches  w/ SBQC 2x10    Other Standing Knee Exercises Alt box taps 4in w/ SBQC x5      Knee/Hip Exercises: Seated   Long Arc Quad Strengthening;Both;2 sets;10 reps    Long Arc Quad Weight 2 lbs.    Sit to  Sand 3 sets;5 reps;without UE support                       PT Short Term Goals - 08/04/21 1222       PT SHORT TERM GOAL #1   Title Patietn will be I iwth basic HEP    Time 4    Period Weeks    Status New    Target Date 09/01/21               PT Long Term Goals - 08/04/21 1223       PT LONG TERM GOAL #1   Title I with final HEP    Time 10    Period Weeks    Status New    Target Date 10/13/21      PT LONG TERM GOAL #2   Title Increase BLE strength to at least 4/5    Baseline (3-)-3/5    Time 10    Period Weeks    Status New  Target Date 10/13/21      PT LONG TERM GOAL #3   Title Perform 5 x STS in < 20 sec to demosntrate improved strength and balance.    Baseline 31    Time 10    Period Weeks    Status New    Target Date 10/13/21      PT LONG TERM GOAL #4   Title Patient will ambulate at least 200' with LRAD with no C/O pain, demosntrating stable, efficient gait.    Baseline 80', SBQC, very slow and unsteady.                   Plan - 08/10/21 1009     Clinical Impression Statement Pt did well with a progressed session. Some increase fatigue with the additional sets of sit to stands with as well as with the standing interventions. Cues to increase bilat hip and knee flex with standing marches. CGA needed with alternating box taps    Comorbidities bilateral breast cancer and treatment, DM, HTN hyperlipidemia, obesity, knee arthritis, bilateral hand tremors    Examination-Activity Limitations Locomotion Level;Transfers;Bed Mobility;Carry;Squat;Stairs;Stand;Lift    Examination-Participation Restrictions Church;Meal Prep;Cleaning;Community Activity;Driving    Rehab Potential Good    PT Treatment/Interventions Therapeutic activities;Therapeutic exercise;Patient/family education;ADLs/Self Care Home Management;Iontophoresis 79m/ml Dexamethasone;Gait training;Stair training;Moist Heat;Ultrasound;Electrical Stimulation;Cryotherapy;DME  Instruction;Balance training;Neuromuscular re-education;Manual techniques;Energy conservation;Dry needling;Passive range of motion;Taping    PT Next Visit Plan OT to screeen for treatment fo R hand, introduce RW, educate patient to safe use, update HEP.             Patient will benefit from skilled therapeutic intervention in order to improve the following deficits and impairments:  Increased edema, Decreased strength, Decreased mobility, Decreased range of motion, Abnormal gait, Decreased coordination, Difficulty walking, Improper body mechanics, Impaired flexibility, Decreased balance, Pain  Visit Diagnosis: Difficulty in walking, not elsewhere classified  Chronic pain of left knee  Unsteadiness on feet  Muscle weakness (generalized)     Problem List Patient Active Problem List   Diagnosis Date Noted   Genetic testing 11/13/2020   Family history of breast cancer    Family history of bladder cancer    Personal history of breast cancer    Malignant neoplasm of left breast in female, estrogen receptor negative ([place] 10/23/2020   Malignant neoplasm of right breast in female, estrogen receptor positive ([place] 10/07/2020   Depression 10/07/2020   Diabetes mellitus ([place] 10/07/2020   Hypercholesterolemia 10/07/2020   Hypertension 10/07/2020   Tremor of both hands 10/07/2020   History of gout 09/26/2020    Toni Stone 08/10/2021, 10:11 AM  [place]oni Stone [id number]: [phone]  Fax:  [phone] Name: Toni DafoeMRN: [id number] of Birth: 808-Jul-1947

## 2021-08-13 ENCOUNTER — Other Ambulatory Visit: Payer: Self-pay

## 2021-08-13 ENCOUNTER — Encounter: Payer: Self-pay | Admitting: Physical Therapy

## 2021-08-13 ENCOUNTER — Ambulatory Visit: Payer: Medicare HMO | Admitting: Physical Therapy

## 2021-08-13 DIAGNOSIS — M6281 Muscle weakness (generalized): Secondary | ICD-10-CM | POA: Diagnosis not present

## 2021-08-13 DIAGNOSIS — R2681 Unsteadiness on feet: Secondary | ICD-10-CM

## 2021-08-13 DIAGNOSIS — G8929 Other chronic pain: Secondary | ICD-10-CM

## 2021-08-13 DIAGNOSIS — M25562 Pain in left knee: Secondary | ICD-10-CM

## 2021-08-13 DIAGNOSIS — R262 Difficulty in walking, not elsewhere classified: Secondary | ICD-10-CM

## 2021-08-13 NOTE — Therapy (Signed)
Toni Stone. [place], [place], [address] Phone: [phone]   Fax:  [phone]  Physical Therapy Treatment  Patient Details  Name: Toni Stone MRN: [id number] Date of Birth: 1945/11/19 Referring Provider (PT): Eldridge Abrahams   Encounter Date: 08/13/2021   PT End of Session - 08/13/21 1011     Visit Number 4    Date for PT Re-Evaluation 10/13/21    PT Start Time 0939    PT Stop Time 7482    PT Time Calculation (min) 36 min    Activity Tolerance Patient tolerated treatment well    Behavior During Therapy [facility] for tasks assessed/performed             Past Medical History:  Diagnosis Date   Anemia    Breast cancer ([place])    Chronic kidney disease    Diabetes mellitus without complication ([place])    Family history of bladder cancer    Family history of breast cancer    Hypertension    Neuromuscular disorder ([place])    Tremor   Personal history of breast cancer     Past Surgical History:  Procedure Laterality Date   ABDOMINAL HYSTERECTOMY     Age 23s, for ectopic pregnancy   BREAST LUMPECTOMY Right 06/23/2004   pT1cN0M0 ER/PR + HER2 - stage IA   BREAST LUMPECTOMY Left 08/23/2018   pT2N0M0 ER/PR/HER2 neg stage IIA    There were no vitals filed for this visit.   Subjective Assessment - 08/13/21 0940     Subjective "No good today" Pt reports some increase R knee pain. pt reports almost falling yesterday getting in to a high [place]    Currently in Pain? Yes    Pain Score 8     Pain Location Knee    Pain Orientation Right                               [facility] Adult PT Treatment/Exercise - 08/13/21 0001       Ambulation/Gait   Ambulation/Gait Yes    Ambulation/Gait Assistance 5: Supervision    Ambulation Distance (Feet) 135 Feet    Assistive device Small based quad cane    Gait Pattern Step-through pattern;Decreased step length - right;Decreased step length - left;Decreased stance time  - right;Decreased stance time - left;Decreased stride length;Lateral hip instability;Lateral trunk lean to right;Lateral trunk lean to left;Wide base of support    Ambulation Surface Level;Unlevel;Indoor;Outdoor;Paved    Gait velocity slow and labored, unsteady      Knee/Hip Exercises: Aerobic   Nustep L3 x 6 min      Knee/Hip Exercises: Seated   Long Arc Quad Strengthening;Both;10 reps;3 sets    Ball Squeeze 2x10    Hamstring Curl Both;2 sets;10 reps    Hamstring Limitations red Tband                       PT Short Term Goals - 08/04/21 1222       PT SHORT TERM GOAL #1   Title Patietn will be I iwth basic HEP    Time 4    Period Weeks    Status New    Target Date 09/01/21               PT Long Term Goals - 08/04/21 1223       PT LONG TERM GOAL #1   Title I  with final HEP    Time 10    Period Weeks    Status New    Target Date 10/13/21      PT LONG TERM GOAL #2   Title Increase BLE strength to at least 4/5    Baseline (3-)-3/5    Time 10    Period Weeks    Status New    Target Date 10/13/21      PT LONG TERM GOAL #3   Title Perform 5 x STS in < 20 sec to demosntrate improved strength and balance.    Baseline 31    Time 10    Period Weeks    Status New    Target Date 10/13/21      PT LONG TERM GOAL #4   Title Patient will ambulate at least 200' with LRAD with no C/O pain, demosntrating stable, efficient gait.    Baseline 80', SBQC, very slow and unsteady.                   Plan - 08/13/21 1011     Clinical Impression Statement Pt ! 9 minutes late for today's session reporting increase R knee pain. Pt stated she almost had a fall stepping in to a high vehicle yesterday. Intervention kept to minium stress. Pt SBQU was to high so to was adjusted to proper length. Pt has a slow unsteady gait. Informed RW may be more efficient and safe.    Comorbidities bilateral breast cancer and treatment, DM, HTN hyperlipidemia, obesity, knee  arthritis, bilateral hand tremors    Examination-Activity Limitations Locomotion Level;Transfers;Bed Mobility;Carry;Squat;Stairs;Stand;Lift    Examination-Participation Restrictions Church;Meal Prep;Cleaning;Community Activity;Driving    Stability/Clinical Decision Making Stable/Uncomplicated    PT Frequency 2x / week    PT Treatment/Interventions Therapeutic activities;Therapeutic exercise;Patient/family education;ADLs/Self Care Home Management;Iontophoresis 68m/ml Dexamethasone;Gait training;Stair training;Moist Heat;Ultrasound;Electrical Stimulation;Cryotherapy;DME Instruction;Balance training;Neuromuscular re-education;Manual techniques;Energy conservation;Dry needling;Passive range of motion;Taping    PT Next Visit Plan OT to screeen for treatment fo R hand, introduce RW, educate patient to safe use, update HEP. Try RW             Patient will benefit from skilled therapeutic intervention in order to improve the following deficits and impairments:  Increased edema, Decreased strength, Decreased mobility, Decreased range of motion, Abnormal gait, Decreased coordination, Difficulty walking, Improper body mechanics, Impaired flexibility, Decreased balance, Pain  Visit Diagnosis: Difficulty in walking, not elsewhere classified  Unsteadiness on feet  Chronic pain of left knee  Left knee pain, unspecified chronicity     Problem List Patient Active Problem List   Diagnosis Date Noted   Genetic testing 11/13/2020   Family history of breast cancer    Family history of bladder cancer    Personal history of breast cancer    Malignant neoplasm of left breast in female, estrogen receptor negative ([place] 10/23/2020   Malignant neoplasm of right breast in female, estrogen receptor positive ([place] 10/07/2020   Depression 10/07/2020   Diabetes mellitus ([place] 10/07/2020   Hypercholesterolemia 10/07/2020   Hypertension 10/07/2020   Tremor of both hands 10/07/2020   History of gout  09/26/2020    [name] 08/13/2021, 10:14 AM  [place] [address]Phone: [phone]  Fax:  [phone] Name: Toni HouptMRN: [id number] of Birth: [birth year]

## 2021-08-17 ENCOUNTER — Other Ambulatory Visit: Payer: Self-pay

## 2021-08-17 ENCOUNTER — Ambulatory Visit: Payer: Medicare HMO | Admitting: Physical Therapy

## 2021-08-17 ENCOUNTER — Encounter: Payer: Self-pay | Admitting: Physical Therapy

## 2021-08-17 DIAGNOSIS — M25562 Pain in left knee: Secondary | ICD-10-CM

## 2021-08-17 DIAGNOSIS — R262 Difficulty in walking, not elsewhere classified: Secondary | ICD-10-CM

## 2021-08-17 DIAGNOSIS — M6281 Muscle weakness (generalized): Secondary | ICD-10-CM | POA: Diagnosis not present

## 2021-08-17 DIAGNOSIS — R2681 Unsteadiness on feet: Secondary | ICD-10-CM

## 2021-08-17 NOTE — Therapy (Signed)
Toni Stone, [place], [address] Phone: [phone]   Fax:  [phone]  Physical Therapy Treatment  Patient Details  Name: Toni Stone MRN: [id number] Date of Birth: 1945-08-19 Referring Provider (PT): Eldridge Abrahams   Encounter Date: 08/17/2021   PT End of Session - 08/17/21 1425     Visit Number 5    Date for PT Re-Evaluation 10/13/21    PT Start Time 1345    PT Stop Time 1429    PT Time Calculation (min) 44 min    Activity Tolerance Patient tolerated treatment well    Behavior During Therapy [facility] for tasks assessed/performed             Past Medical History:  Diagnosis Date   Anemia    Breast cancer ([place])    Chronic kidney disease    Diabetes mellitus without complication ([place])    Family history of bladder cancer    Family history of breast cancer    Hypertension    Neuromuscular disorder ([place])    Tremor   Personal history of breast cancer     Past Surgical History:  Procedure Laterality Date   ABDOMINAL HYSTERECTOMY     Age 69s, for ectopic pregnancy   BREAST LUMPECTOMY Right 06/23/2004   pT1cN0M0 ER/PR + HER2 - stage IA   BREAST LUMPECTOMY Left 08/23/2018   pT2N0M0 ER/PR/HER2 neg stage IIA    There were no vitals filed for this visit.   Subjective Assessment - 08/17/21 1347     Subjective "Good" Im walking the way you told me    Currently in Pain? Yes    Pain Score 5     Pain Location Knee    Pain Orientation Right                               [facility] Adult PT Treatment/Exercise - 08/17/21 0001       Ambulation/Gait   Ambulation/Gait Yes    Ambulation/Gait Assistance 5: Supervision;4: Min guard    Ambulation Distance (Feet) 200 Feet    Assistive device Small based quad cane    Gait Pattern Step-through pattern;Decreased step length - right;Decreased step length - left;Decreased stance time - right;Decreased stance time - left;Decreased stride  length;Lateral hip instability;Lateral trunk lean to right;Lateral trunk lean to left;Wide base of support    Ambulation Surface Level;Indoor    Gait velocity slow and labored, unsteady as she fatigues    Gait Comments took ~ 3:55 min, c/o R knee stiffness      Knee/Hip Exercises: Aerobic   Nustep L3 x 6 min      Knee/Hip Exercises: Standing   Other Standing Knee Exercises Alt box taps 4in w/ SBQC x5      Knee/Hip Exercises: Seated   Long Arc Quad Strengthening;Both;10 reps;3 sets    Long Arc Quad Weight 3 lbs.    Ball Squeeze 2x10    Hamstring Curl Both;2 sets;10 reps    Hamstring Limitations green Tband    Sit to Sand 5 reps;without UE support;2 sets                       PT Short Term Goals - 08/04/21 1222       PT SHORT TERM GOAL #1   Title Patietn will be I iwth basic HEP    Time 4    Period Weeks  Status New    Target Date 09/01/21               PT Long Term Goals - 08/17/21 1402       PT LONG TERM GOAL #2   Title Increase BLE strength to at least 4/5    Status On-going      PT LONG TERM GOAL #3   Title Perform 5 x STS in < 20 sec to demosntrate improved strength and balance.    Status Achieved   16.19                  Plan - 08/17/21 1427     Clinical Impression Statement Pt enters clinic feeling well. She reports less pain compared to the start of last session. She has progressed decreasing her five times sit to stand times. Increase resistance tolerated with seated HS curls and LAQ. She progressed increasing her gait distance but required increase time and tends to become unsteady as she fatigues.    Comorbidities bilateral breast cancer and treatment, DM, HTN hyperlipidemia, obesity, knee arthritis, bilateral hand tremors    Examination-Activity Limitations Locomotion Level;Transfers;Bed Mobility;Carry;Squat;Stairs;Stand;Lift    Examination-Participation Restrictions Church;Meal Prep;Cleaning;Community Activity;Driving     Stability/Clinical Decision Making Stable/Uncomplicated    Rehab Potential Good    PT Frequency 2x / week    PT Treatment/Interventions Therapeutic activities;Therapeutic exercise;Patient/family education;ADLs/Self Care Home Management;Iontophoresis 18m/ml Dexamethasone;Gait training;Stair training;Moist Heat;Ultrasound;Electrical Stimulation;Cryotherapy;DME Instruction;Balance training;Neuromuscular re-education;Manual techniques;Energy conservation;Dry needling;Passive range of motion;Taping    PT Next Visit Plan OT to screeen for treatment fo R hand, introduce RW, educate patient to safe use, update HEP. Try RW             Patient will benefit from skilled therapeutic intervention in order to improve the following deficits and impairments:  Increased edema, Decreased strength, Decreased mobility, Decreased range of motion, Abnormal gait, Decreased coordination, Difficulty walking, Improper body mechanics, Impaired flexibility, Decreased balance, Pain  Visit Diagnosis: Difficulty in walking, not elsewhere classified  Unsteadiness on feet  Left knee pain, unspecified chronicity     Problem List Patient Active Problem List   Diagnosis Date Noted   Genetic testing 11/13/2020   Family history of breast cancer    Family history of bladder cancer    Personal history of breast cancer    Malignant neoplasm of left breast in female, estrogen receptor negative ([facility] 10/23/2020   Malignant neoplasm of right breast in female, estrogen receptor positive ([facility] 10/07/2020   Depression 10/07/2020   Diabetes mellitus ([place] 10/07/2020   Hypercholesterolemia 10/07/2020   Hypertension 10/07/2020   Tremor of both hands 10/07/2020   History of gout 09/26/2020    [name] 08/17/2021, 2:29 PM  [place] [address]Phone: [phone]  Fax:  [phone] Name: FPierce BiaginiMRN:  [id number] of Birth: 8November 03, 1947

## 2021-08-20 ENCOUNTER — Ambulatory Visit: Payer: Medicare HMO | Attending: Nurse Practitioner | Admitting: Physical Therapy

## 2021-08-20 ENCOUNTER — Other Ambulatory Visit: Payer: Self-pay

## 2021-08-20 ENCOUNTER — Encounter: Payer: Self-pay | Admitting: Physical Therapy

## 2021-08-20 DIAGNOSIS — M6281 Muscle weakness (generalized): Secondary | ICD-10-CM

## 2021-08-20 DIAGNOSIS — R262 Difficulty in walking, not elsewhere classified: Secondary | ICD-10-CM

## 2021-08-20 DIAGNOSIS — G8929 Other chronic pain: Secondary | ICD-10-CM

## 2021-08-20 DIAGNOSIS — M25562 Pain in left knee: Secondary | ICD-10-CM | POA: Diagnosis present

## 2021-08-20 DIAGNOSIS — R2681 Unsteadiness on feet: Secondary | ICD-10-CM

## 2021-08-20 NOTE — Patient Instructions (Signed)
Access Code: [email or web address] ?URL: [email or web address] ?Date: 08/20/2021 ?Prepared by: Ethel Rana ? ?Exercises ?Seated Long Arc Quad - 1 x daily - 7 x weekly - 2 sets - 10 reps ?Seated Hip Abduction - 1 x daily - 7 x weekly - 2 sets - 10 reps ?Seated Heel Toe Raises - 1 x daily - 7 x weekly - 2 sets - 10 reps ?Seated Hamstring Stretch - 1 x daily - 7 x weekly - 3 sets - 10 hold ?Sit to Stand with Hands on Knees - 1 x daily - 7 x weekly - 2 sets - 10 reps ?Standing Hip Abduction with Counter Support - 1 x daily - 7 x weekly - 2 sets - 10 reps ?Standing Hip Extension with Counter Support - 1 x daily - 7 x weekly - 2 sets - 10 reps ?Standing Heel Raise with Support - 1 x daily - 7 x weekly - 2 sets - 10 reps ? ?

## 2021-08-20 NOTE — Therapy (Signed)
[place] ?[place] ?[address] ?[place], [place], [address] ?Phone: [phone]   Fax:  [phone] ? ?Physical Therapy Treatment ? ?Patient Details  ?Name: Toni Stone ?MRN: [id number] ?Date of Birth: Sep 02, 1945 ?Referring Provider (PT): Eldridge Abrahams ? ? ?Encounter Date: 08/20/2021 ? ? PT End of Session - 08/20/21 1051   ? ? Visit Number 6   ? Date for PT Re-Evaluation 10/13/21   ? PT Start Time 1031   ? PT Stop Time 1054   ? PT Time Calculation (min) 23 min   ? Activity Tolerance Patient tolerated treatment well   ? Behavior During Therapy [facility] for tasks assessed/performed   ? ?  ?  ? ?  ? ? ?Past Medical History:  ?Diagnosis Date  ? Anemia   ? Breast cancer ([place])   ? Chronic kidney disease   ? Diabetes mellitus without complication ([place])   ? Family history of bladder cancer   ? Family history of breast cancer   ? Hypertension   ? Neuromuscular disorder ([place])   ? Tremor  ? Personal history of breast cancer   ? ? ?Past Surgical History:  ?Procedure Laterality Date  ? ABDOMINAL HYSTERECTOMY    ? Age 30s, for ectopic pregnancy  ? BREAST LUMPECTOMY Right 06/23/2004  ? pT1cN0M0 ER/PR + HER2 - stage IA  ? BREAST LUMPECTOMY Left 08/23/2018  ? pT2N0M0 ER/PR/HER2 neg stage IIA  ? ? ?There were no vitals filed for this visit. ? ? Subjective Assessment - 08/20/21 1032   ? ? Subjective Patient arrived a bit late. She reports no changes. Her R knee is stiff, baseline.   ? Pertinent History right breast cancer in 2007 with chemo, lumpectomy and radiation and arimedex . left breast cancer 06/2018 lumpectomy with post op swelling that opened up and drained , chemo and radiation,  Pt was treated for this in [place] . She moved to [place] in Nov. 2021 Past history includes tremors of her hands left > right, IDDM, HTN, sometimes her right knee will "go out " her so she uses a quad cane   ? Currently in Pain? No/denies   ? ?  ?  ? ?  ? ? ? ? ? ? ? ? ? ? ? ? ? ? ? ? ? ? ? ? [place] Adult PT  Treatment/Exercise - 08/20/21 0001   ? ?  ? Knee/Hip Exercises: Aerobic  ? Nustep L3 x 5 minutes.   ?  ? Knee/Hip Exercises: Standing  ? Heel Raises Both;1 set;10 reps   ? Hip Abduction Stengthening;Both;1 set;10 reps   ? Abduction Limitations BUE support, Painful in R stance. Also performed B side stepping.   ? Hip Extension Both;1 set;10 reps   ? ?  ?  ? ?  ? ? ? ? ? ? ? ? ? ? PT Education - 08/20/21 1054   ? ? Education Details Updated HEP.   ? Person(s) Educated Patient   ? Methods Explanation;Demonstration;Handout   ? Comprehension Verbalized understanding;Returned demonstration   ? ?  ?  ? ?  ? ? ? PT Short Term Goals - 08/20/21 1106   ? ?  ? PT SHORT TERM GOAL #1  ? Title Patietn will be I iwth basic HEP   ? Time 2   ? Period Weeks   ? Status On-going   ? Target Date 09/01/21   ? ?  ?  ? ?  ? ? ? ? PT Long  Term Goals - 08/17/21 1402   ? ?  ? PT LONG TERM GOAL #2  ? Title Increase BLE strength to at least 4/5   ? Status On-going   ?  ? PT LONG TERM GOAL #3  ? Title Perform 5 x STS in < 20 sec to demosntrate improved strength and balance.   ? Status Achieved   16.19  ? ?  ?  ? ?  ? ? ? ? ? ? ? ? Plan - 08/20/21 1105   ? ? Clinical Impression Statement Patietn arrived a bit late. She reports that her HEP is too easy. Perfirmed standing activities for strength and updated HEP.   ? Personal Factors and Comorbidities Comorbidity 3+   ? Comorbidities bilateral breast cancer and treatment, DM, HTN hyperlipidemia, obesity, knee arthritis, bilateral hand tremors   ? Examination-Activity Limitations Locomotion Level;Transfers;Bed Mobility;Carry;Squat;Stairs;Stand;Lift   ? Examination-Participation Restrictions Church;Meal Prep;Cleaning;Community Activity;Driving   ? Stability/Clinical Decision Making Stable/Uncomplicated   ? Clinical Decision Making Moderate   ? Rehab Potential Good   ? PT Frequency 2x / week   ? PT Duration 8 weeks   ? PT Treatment/Interventions Therapeutic activities;Therapeutic  exercise;Patient/family education;ADLs/Self Care Home Management;Iontophoresis 27m/ml Dexamethasone;Gait training;Stair training;Moist Heat;Ultrasound;Electrical Stimulation;Cryotherapy;DME Instruction;Balance training;Neuromuscular re-education;Manual techniques;Energy conservation;Dry needling;Passive range of motion;Taping   ? PT Next Visit Plan Assess tolerance to HEP   ? PT [place]  ? Consulted and Agree with Plan of Care Patient   ? ?  ?  ? ?  ? ? ?Patient will benefit from skilled therapeutic intervention in order to improve the following deficits and impairments:  Increased edema, Decreased strength, Decreased mobility, Decreased range of motion, Abnormal gait, Decreased coordination, Difficulty walking, Improper body mechanics, Impaired flexibility, Decreased balance, Pain ? ?Visit Diagnosis: ?Difficulty in walking, not elsewhere classified ? ?Unsteadiness on feet ? ?Left knee pain, unspecified chronicity ? ?Chronic pain of left knee ? ?Muscle weakness (generalized) ? ? ? ? ?Problem List ?Patient Active Problem List  ? Diagnosis Date Noted  ? Genetic testing 11/13/2020  ? Family history of breast cancer   ? Family history of bladder cancer   ? Personal history of breast cancer   ? Malignant neoplasm of left breast in female, estrogen receptor negative ([place] 10/23/2020  ? Malignant neoplasm of right breast in female, estrogen receptor positive ([place] 10/07/2020  ? Depression 10/07/2020  ? Diabetes mellitus ([place] 10/07/2020  ? Hypercholesterolemia 10/07/2020  ? Hypertension 10/07/2020  ? Tremor of both hands 10/07/2020  ? History of gout 09/26/2020  ? ? ?SMarcelina Morel DPT ?08/20/2021, 11:07 AM ? ?[place] ?[address] ?[place] [address]?Phone: [phone]  Fax:  [phone]? ?Name: Toni Stone?MRN: [id number]?Date of Birth: 8January 17, 1947? ? ? ?

## 2021-08-24 ENCOUNTER — Encounter: Payer: Self-pay | Admitting: Physical Therapy

## 2021-08-24 ENCOUNTER — Other Ambulatory Visit: Payer: Self-pay

## 2021-08-24 ENCOUNTER — Ambulatory Visit: Payer: Medicare HMO | Admitting: Physical Therapy

## 2021-08-24 DIAGNOSIS — M25562 Pain in left knee: Secondary | ICD-10-CM

## 2021-08-24 DIAGNOSIS — R262 Difficulty in walking, not elsewhere classified: Secondary | ICD-10-CM

## 2021-08-24 DIAGNOSIS — R2681 Unsteadiness on feet: Secondary | ICD-10-CM

## 2021-08-24 DIAGNOSIS — G8929 Other chronic pain: Secondary | ICD-10-CM

## 2021-08-24 NOTE — Therapy (Signed)
[place] ?[place] ?[address] ?[place], [place], [address] ?Phone: [phone]   Fax:  [phone] ? ?Physical Therapy Treatment ? ?Patient Details  ?Name: Toni Stone ?MRN: [id number] ?Date of Birth: [birth year] ?Referring Provider (PT): Eldridge Abrahams ? ? ?Encounter Date: 08/24/2021 ? ? PT End of Session - 08/24/21 1146   ? ? Visit Number 7   ? Date for PT Re-Evaluation 10/13/21   ? PT Start Time 1100   ? PT Stop Time 1145   ? PT Time Calculation (min) 45 min   ? Activity Tolerance Patient tolerated treatment well   ? Behavior During Therapy [facility] for tasks assessed/performed   ? ?  ?  ? ?  ? ? ?Past Medical History:  ?Diagnosis Date  ? Anemia   ? Breast cancer ([place])   ? Chronic kidney disease   ? Diabetes mellitus without complication ([place])   ? Family history of bladder cancer   ? Family history of breast cancer   ? Hypertension   ? Neuromuscular disorder ([place])   ? Tremor  ? Personal history of breast cancer   ? ? ?Past Surgical History:  ?Procedure Laterality Date  ? ABDOMINAL HYSTERECTOMY    ? Age 12s, for ectopic pregnancy  ? BREAST LUMPECTOMY Right 06/23/2004  ? pT1cN0M0 ER/PR + HER2 - stage IA  ? BREAST LUMPECTOMY Left 08/23/2018  ? pT2N0M0 ER/PR/HER2 neg stage IIA  ? ? ?There were no vitals filed for this visit. ? ? Subjective Assessment - 08/24/21 1057   ? ? Subjective "Ok" knee feel stiff as usual   ? Currently in Pain? No/denies   ? ?  ?  ? ?  ? ? ? ? ? ? ? ? ? ? ? ? ? ? ? ? ? ? ? ? [place] Adult PT Treatment/Exercise - 08/24/21 0001   ? ?  ? Ambulation/Gait  ? Stairs Yes   ? Stairs Assistance 4: Min guard;5: Supervision;6: Modified independent (Device/Increase time)   ? Stair Management Technique Two rails;Alternating pattern;Step to pattern;Sideways   ? Number of Stairs 6   ? Height of Stairs 4   ? Gait Comments Side ways descending leading with RLE. very weak and fearfull descending with LLE with eccentric load on RLE   ?  ? Knee/Hip Exercises: Aerobic  ? Nustep L3  x 6 min   ?  ? Knee/Hip Exercises: Standing  ? Other Standing Knee Exercises Alt box taps 4in w/ SBQC 2x5   ?  ? Knee/Hip Exercises: Seated  ? Long Arc Quad Strengthening;Both;10 reps;3 sets   ? Long Arc Quad Weight 3 lbs.   ? Other Seated Knee/Hip Exercises RLE Fitter press 1 black 2x15   ? Sit to Sand 2 sets;10 reps;5 reps;without UE support   UE on knees  ? ?  ?  ? ?  ? ? ? ? ? ? ? ? ? ? ? ? PT Short Term Goals - 08/20/21 1106   ? ?  ? PT SHORT TERM GOAL #1  ? Title Patietn will be I iwth basic HEP   ? Time 2   ? Period Weeks   ? Status On-going   ? Target Date 09/01/21   ? ?  ?  ? ?  ? ? ? ? PT Long Term Goals - 08/17/21 1402   ? ?  ? PT LONG TERM GOAL #2  ? Title Increase BLE strength to at least 4/5   ? Status On-going   ?  ?  PT LONG TERM GOAL #3  ? Title Perform 5 x STS in < 20 sec to demosntrate improved strength and balance.   ? Status Achieved   16.19  ? ?  ?  ? ?  ? ? ? ? ? ? ? ? Plan - 08/24/21 1148   ? ? Clinical Impression Statement Pt able to complete all of today's interventions. RLE weakness present concentrically and eccentrically with stair negotiation requiring compensation. Added fitter press for more R quad and glute engagement. no report of increase pain. She demo ed good stability with alt box taps.   ? Comorbidities bilateral breast cancer and treatment, DM, HTN hyperlipidemia, obesity, knee arthritis, bilateral hand tremors   ? Examination-Activity Limitations Locomotion Level;Transfers;Bed Mobility;Carry;Squat;Stairs;Stand;Lift   ? Examination-Participation Restrictions Church;Meal Prep;Cleaning;Community Activity;Driving   ? Rehab Potential Good   ? PT Frequency 2x / week   ? PT Duration 8 weeks   ? PT Treatment/Interventions Therapeutic activities;Therapeutic exercise;Patient/family education;ADLs/Self Care Home Management;Iontophoresis 21m/ml Dexamethasone;Gait training;Stair training;Moist Heat;Ultrasound;Electrical Stimulation;Cryotherapy;DME Instruction;Balance training;Neuromuscular  re-education;Manual techniques;Energy conservation;Dry needling;Passive range of motion;Taping   ? PT Next Visit Plan LE functional strength   ? ?  ?  ? ?  ? ? ?Patient will benefit from skilled therapeutic intervention in order to improve the following deficits and impairments:  Increased edema, Decreased strength, Decreased mobility, Decreased range of motion, Abnormal gait, Decreased coordination, Difficulty walking, Improper body mechanics, Impaired flexibility, Decreased balance, Pain ? ?Visit Diagnosis: ?Difficulty in walking, not elsewhere classified ? ?Left knee pain, unspecified chronicity ? ?Chronic pain of left knee ? ?Unsteadiness on feet ? ? ? ? ?Problem List ?Patient Active Problem List  ? Diagnosis Date Noted  ? Genetic testing 11/13/2020  ? Family history of breast cancer   ? Family history of bladder cancer   ? Personal history of breast cancer   ? Malignant neoplasm of left breast in female, estrogen receptor negative ([facility] 10/23/2020  ? Malignant neoplasm of right breast in female, estrogen receptor positive ([facility] 10/07/2020  ? Depression 10/07/2020  ? Diabetes mellitus ([place] 10/07/2020  ? Hypercholesterolemia 10/07/2020  ? Hypertension 10/07/2020  ? Tremor of both hands 10/07/2020  ? History of gout 09/26/2020  ? ? ?[name] ?08/24/2021, 11:50 AM ? ?[place] ?[place] ?[place] [address]?Phone: [phone]  Fax:  [phone]? ?Name: FMelicia Esqueda?MRN: [id number]?Date of Birth: [birth year]? ? ? ?

## 2021-08-27 ENCOUNTER — Other Ambulatory Visit: Payer: Self-pay

## 2021-08-27 ENCOUNTER — Ambulatory Visit: Payer: Medicare HMO | Admitting: Physical Therapy

## 2021-08-27 ENCOUNTER — Encounter: Payer: Self-pay | Admitting: Physical Therapy

## 2021-08-27 DIAGNOSIS — G8929 Other chronic pain: Secondary | ICD-10-CM

## 2021-08-27 DIAGNOSIS — R262 Difficulty in walking, not elsewhere classified: Secondary | ICD-10-CM | POA: Diagnosis not present

## 2021-08-27 DIAGNOSIS — M25562 Pain in left knee: Secondary | ICD-10-CM

## 2021-08-27 DIAGNOSIS — M6281 Muscle weakness (generalized): Secondary | ICD-10-CM

## 2021-08-27 DIAGNOSIS — R2681 Unsteadiness on feet: Secondary | ICD-10-CM

## 2021-08-27 NOTE — Therapy (Signed)
[facility] ?[address] ?[place], [place], [address] ?Phone: [phone]   Fax:  [phone] ? ?Physical Therapy Treatment ? ?Patient Details  ?Name: Toni Stone ?MRN: [id number] ?Date of Birth: [birth year] ?Referring Provider (PT): Toni Stone ? ? ?Encounter Date: 08/27/2021 ? ? PT End of Session - 08/27/21 1058   ? ? Visit Number 8   ? Date for PT Re-Evaluation 10/13/21   ? PT Start Time 1021   ? PT Stop Time 1100   ? PT Time Calculation (min) 39 min   ? Activity Tolerance Patient tolerated treatment well   ? Behavior During Therapy Toni Stone for tasks assessed/performed   ? ?  ?  ? ?  ? ? ?Past Medical History:  ?Diagnosis Date  ? Anemia   ? Breast cancer ([place])   ? Chronic kidney disease   ? Diabetes mellitus without complication ([place])   ? Family history of bladder cancer   ? Family history of breast cancer   ? Hypertension   ? Neuromuscular disorder ([place])   ? Tremor  ? Personal history of breast cancer   ? ? ?Past Surgical History:  ?Procedure Laterality Date  ? ABDOMINAL HYSTERECTOMY    ? Age 20s, for ectopic pregnancy  ? BREAST LUMPECTOMY Right 06/23/2004  ? pT1cN0M0 ER/PR + HER2 - stage IA  ? BREAST LUMPECTOMY Left 08/23/2018  ? pT2N0M0 ER/PR/HER2 neg stage IIA  ? ? ?There were no vitals filed for this visit. ? ? Subjective Assessment - 08/27/21 1022   ? ? Subjective Knee continues to feel stiff.   ? Pertinent History right breast cancer in 2007 with chemo, lumpectomy and radiation and arimedex . left breast cancer 06/2018 lumpectomy with post op swelling that opened up and drained , chemo and radiation,  Pt was treated for this in [place]. She [place] [facility] Nov[facility] [place] Past history includes tremors of her hands left > right, IDDM, HTN, sometimes her right knee will "go out " her so she uses a quad cane   ? Currently in Pain? No/denies   ? ?  ?  ? ?  ? ? ? ? ? ? ? ? ? ? ? ? ? ? ? ? ? ? ? ? [facility] Adult PT Treatment/Exercise - 08/27/21 0001   ? ?  ? Knee/Hip  Exercises: Aerobic  ? Nustep L4 x 5 minutes   ?  ? Knee/Hip Exercises: Standing  ? Gait Training 20# 3 reps forward, backward, 2 x each side step   ? ?  ?  ? ?  ? ? ? ? ? ? ? ? ? ? ? ? PT Short Term Goals - 08/27/21 1039   ? ?  ? PT SHORT TERM GOAL #1  ? Title Patietn will be I iwth basic HEP   ? Time 2   ? Period Weeks   ? Status Achieved   ? Target Date 09/01/21   ? ?  ?  ? ?  ? ? ? ? PT Long Term Goals - 08/17/21 1402   ? ?  ? PT LONG TERM GOAL #2  ? Title Increase BLE strength to at least 4/5   ? Status On-going   ?  ? PT LONG TERM GOAL #3  ? Title Perform 5 x STS in < 20 sec to demosntrate improved strength and balance.   ? Status Achieved   16.19  ? ?  ?  ? ?  ? ? ? ? ? ? ? ?  Plan - 08/27/21 1045   ? ? Clinical Impression Statement Patient reports R knee continues to be stiff, especially in the mornings. She particiapted well in progressive strengthening exercises, with no C/O pain while moving against resistance.   ? Personal Factors and Comorbidities Comorbidity 3+   ? Comorbidities bilateral breast cancer and treatment, DM, HTN hyperlipidemia, obesity, knee arthritis, bilateral hand tremors   ? Examination-Activity Limitations Locomotion Level;Transfers;Bed Mobility;Carry;Squat;Stairs;Stand;Lift   ? Examination-Participation Restrictions Church;Meal Prep;Cleaning;Community Activity;Driving   ? Stability/Clinical Decision Making Stable/Uncomplicated   ? Clinical Decision Making Moderate   ? Rehab Potential Good   ? PT Frequency 2x / week   ? PT Duration 8 weeks   ? PT Treatment/Interventions Therapeutic activities;Therapeutic exercise;Patient/family education;ADLs/Self Care Home Management;Iontophoresis 31m/ml Dexamethasone;Gait training;Stair training;Moist Heat;Ultrasound;Electrical Stimulation;Cryotherapy;DME Instruction;Balance training;Neuromuscular re-education;Manual techniques;Energy conservation;Dry needling;Passive range of motion;Taping   ? PT Next Visit Plan LE functional strength   ? PT [place]  ? Consulted and Agree with Plan of Care Patient   ? ?  ?  ? ?  ? ? ?Patient will benefit from skilled therapeutic intervention in order to improve the following deficits and impairments:  Increased edema, Decreased strength, Decreased mobility, Decreased range of motion, Abnormal gait, Decreased coordination, Difficulty walking, Improper body mechanics, Impaired flexibility, Decreased balance, Pain ? ?Visit Diagnosis: ?Difficulty in walking, not elsewhere classified ? ?Left knee pain, unspecified chronicity ? ?Chronic pain of left knee ? ?Unsteadiness on feet ? ?Muscle weakness (generalized) ? ? ? ? ?Problem List ?Patient Active Problem List  ? Diagnosis Date Noted  ? Genetic testing 11/13/2020  ? Family history of breast cancer   ? Family history of bladder cancer   ? Personal history of breast cancer   ? 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 Malignant neoplasm of right breast in female, estrogen receptor positive ([place] 10/07/2020  ? Depression 10/07/2020  ? Diabetes mellitus ([place] 10/07/2020  ? Hypercholesterolemia 10/07/2020  ? Hypertension 10/07/2020  ? Tremor of both hands 10/07/2020  ? History of gout 09/26/2020  ? ? ?SMarcelina Stone DPT ?08/27/2021, 10:59 AM ? ?[facility] [address] ?[place] [address]?Phone: [phone]  Fax:  [phone]? ?Name: FCalissa Stone?MRN: [id number]?Date of Birth: 8April 17, 1947? ? ? ?

## 2021-08-31 ENCOUNTER — Encounter: Payer: Self-pay | Admitting: Physical Therapy

## 2021-08-31 ENCOUNTER — Ambulatory Visit: Payer: Medicare HMO | Admitting: Physical Therapy

## 2021-08-31 ENCOUNTER — Other Ambulatory Visit: Payer: Self-pay

## 2021-08-31 DIAGNOSIS — R262 Difficulty in walking, not elsewhere classified: Secondary | ICD-10-CM | POA: Diagnosis not present

## 2021-08-31 DIAGNOSIS — M25562 Pain in left knee: Secondary | ICD-10-CM

## 2021-08-31 DIAGNOSIS — M6281 Muscle weakness (generalized): Secondary | ICD-10-CM

## 2021-08-31 DIAGNOSIS — G8929 Other chronic pain: Secondary | ICD-10-CM

## 2021-08-31 DIAGNOSIS — R2681 Unsteadiness on feet: Secondary | ICD-10-CM

## 2021-08-31 NOTE — Therapy (Signed)
[facility] ?[place] ?[address] ?[place], [place], [address] ?Phone: [phone]   Fax:  [phone] ? ?Physical Therapy Treatment ? ?Patient Details  ?Name: Toni Stone ?MRN: [id number] ?Date of Birth: 07-15-1945 ?Referring Provider (PT): Eldridge Abrahams ? ? ?Encounter Date: 08/31/2021 ? ? PT End of Session - 08/31/21 1229   ? ? Visit Number 9   ? Date for PT Re-Evaluation 10/13/21   ? PT Start Time 1146   ? PT Stop Time 7902   ? PT Time Calculation (min) 43 min   ? Activity Tolerance Patient tolerated treatment well   ? Behavior During Therapy [facility] for tasks assessed/performed   ? ?  ?  ? ?  ? ? ?Past Medical History:  ?Diagnosis Date  ? Anemia   ? Breast cancer ([place])   ? Chronic kidney disease   ? Diabetes mellitus without complication ([place])   ? Family history of bladder cancer   ? Family history of breast cancer   ? Hypertension   ? Neuromuscular disorder ([place])   ? Tremor  ? Personal history of breast cancer   ? ? ?Past Surgical History:  ?Procedure Laterality Date  ? ABDOMINAL HYSTERECTOMY    ? Age 8s, for ectopic pregnancy  ? BREAST LUMPECTOMY Right 06/23/2004  ? pT1cN0M0 ER/PR + HER2 - stage IA  ? BREAST LUMPECTOMY Left 08/23/2018  ? pT2N0M0 ER/PR/HER2 neg stage IIA  ? ? ?There were no vitals filed for this visit. ? ? Subjective Assessment - 08/31/21 1152   ? ? Subjective Patient reports knee stiffness ocntinues. She tried some gentle exercises in bed prior to sitting up and it helped some.   ? Pertinent History right breast cancer in 2007 with chemo, lumpectomy and radiation and arimedex . left breast cancer 06/2018 lumpectomy with post op swelling that opened up and drained , chemo and radiation,  Pt was treated for this in [place]. She moved to [facility] in Nov. 2021 Past history includes tremors of her hands left > right, IDDM, HTN, sometimes her right knee will "go out " her so she uses a quad cane   ? Currently in Pain? No/denies   ? ?  ?  ? ?   ? ? ? ? ? ? ? ? ? ? ? ? ? ? ? ? ? ? ? ? [facility] Adult PT Treatment/Exercise - 08/31/21 0001   ? ?  ? Knee/Hip Exercises: Standing  ? Other Standing Knee Exercises Pick up cone in front, then step back with R foot, and into rotation to R, weight shfiting onto RLE and reaching back to place the cone on mat behind trunk. 5 x in each direction, required multiple steps to turn to L. CGA, stable.   ? Other Standing Knee Exercises B side to side steps, quick feet, 12 x to each side.   ?  ? Knee/Hip Exercises: Seated  ? Long Arc Quad Strengthening;Both;10 reps;3 sets   ? Long Arc Quad Weight 3 lbs.   ? Ball Squeeze 2 x 15   ? Clamshell with [employer]   2 x 15 reps  ? Marching Both;2 sets;10 reps   ? Marching Limitations 3# weights, start with feet on 6" step, lift knee and place feet out to the sides of the box, then lift and return feet on top of the box.   ? ?  ?  ? ?  ? ? ? ? ? ? ? ? ? ? ? ?  PT Short Term Goals - 08/27/21 1039   ? ?  ? PT SHORT TERM GOAL #1  ? Title Patietn will be I iwth basic HEP   ? Time 2   ? Period Weeks   ? Status Achieved   ? Target Date 09/01/21   ? ?  ?  ? ?  ? ? ? ? PT Long Term Goals - 08/31/21 1223   ? ?  ? PT LONG TERM GOAL #1  ? Title I with final HEP   ? Baseline Updated HEp remains challenging.   ? Time 5   ? Period Weeks   ? Status New   ? Target Date 10/13/21   ?  ? PT LONG TERM GOAL #2  ? Title Increase BLE strength to at least 4/5   ? Status On-going   ?  ? PT LONG TERM GOAL #3  ? Title Perform 5 x STS in < 20 sec to demosntrate improved strength and balance.   ? Status Achieved   16.19  ? ?  ?  ? ?  ? ? ? ? ? ? ? ? Plan - 08/31/21 1157   ? ? Clinical Impression Statement Patient was asked by a friend why she doesnt get a knee replacement. Therapsit referred her to request a consult with an orthopedic surgeon or a PM& R physician to explore any options she may have for treatment of knee pain and stiffness. Treatemtn focused on BLE and trunk strength and stability, then moved to  standing activities for balance. Tolelrated well.   ? Personal Factors and Comorbidities Comorbidity 3+   ? Comorbidities bilateral breast cancer and treatment, DM, HTN hyperlipidemia, obesity, knee arthritis, bilateral hand tremors   ? Examination-Activity Limitations Locomotion Level;Transfers;Bed Mobility;Carry;Squat;Stairs;Stand;Lift   ? Examination-Participation Restrictions Church;Meal Prep;Cleaning;Community Activity;Driving   ? Stability/Clinical Decision Making Stable/Uncomplicated   ? Clinical Decision Making Moderate   ? Rehab Potential Good   ? PT Frequency 2x / week   ? PT Duration 8 weeks   ? PT Treatment/Interventions Therapeutic activities;Therapeutic exercise;Patient/family education;ADLs/Self Care Home Management;Iontophoresis 80m/ml Dexamethasone;Gait training;Stair training;Moist Heat;Ultrasound;Electrical Stimulation;Cryotherapy;DME Instruction;Balance training;Neuromuscular re-education;Manual techniques;Energy conservation;Dry needling;Passive range of motion;Taping   ? PT Next Visit Plan LE functional strength   ? PT [place]  ? Consulted and Agree with Plan of Care Patient   ? ?  ?  ? ?  ? ? ?Patient will benefit from skilled therapeutic intervention in order to improve the following deficits and impairments:  Increased edema, Decreased strength, Decreased mobility, Decreased range of motion, Abnormal gait, Decreased coordination, Difficulty walking, Improper body mechanics, Impaired flexibility, Decreased balance, Pain ? ?Visit Diagnosis: ?Difficulty in walking, not elsewhere classified ? ?Left knee pain, unspecified chronicity ? ?Chronic pain of left knee ? ?Unsteadiness on feet ? ?Muscle weakness (generalized) ? ? ? ? ?Problem List ?Patient Active Problem List  ? Diagnosis Date Noted  ? Genetic testing 11/13/2020  ? Family history of breast cancer   ? Family history of bladder cancer   ? Personal history of breast cancer   ? Malignant neoplasm of left breast in female,  estrogen receptor negative ([place] 10/23/2020  ? Malignant neoplasm of right breast in female, estrogen receptor positive ([place] 10/07/2020  ? Depression 10/07/2020  ? Diabetes mellitus ([place] 10/07/2020  ? Hypercholesterolemia 10/07/2020  ? Hypertension 10/07/2020  ? Tremor of both hands 10/07/2020  ? History of gout 09/26/2020  ? ? ?SMarcelina Morel DPT ?08/31/2021, 12:30 PM ? ?[facility] ?  [address] ?[address] ?[place], [place], [address] ?Phone: [phone]   Fax:  [phone] ? ?Name: Gwendy Boeder ?MRN: [id number] ?Date of Birth: December 20, 1945 ? ? ? ?

## 2021-09-03 ENCOUNTER — Ambulatory Visit: Payer: Medicare HMO | Admitting: Physical Therapy

## 2021-09-03 ENCOUNTER — Other Ambulatory Visit: Payer: Self-pay

## 2021-09-03 ENCOUNTER — Encounter: Payer: Self-pay | Admitting: Physical Therapy

## 2021-09-03 DIAGNOSIS — R262 Difficulty in walking, not elsewhere classified: Secondary | ICD-10-CM | POA: Diagnosis not present

## 2021-09-03 NOTE — Therapy (Signed)
[facility] ?[place] ?[address] ?[place], [place], [address] ?Phone: [phone]   Fax:  [phone] ? ?Physical Therapy Treatment ?Progress Note ?Reporting Period 08/04/21 to 09/03/21 ? ?See note below for Objective Data and Assessment of Progress/Goals.  ? ?  ?Patient Details  ?Name: Toni Stone ?MRN: [id number] ?Date of Birth: [birth year] ?Referring Provider (PT): Eldridge Abrahams ? ? ?Encounter Date: 09/03/2021 ? ? PT End of Session - 09/03/21 1224   ? ? Visit Number 10   ? Date for PT Re-Evaluation 10/13/21   ? PT Start Time 1146   ? PT Stop Time 0350   ? PT Time Calculation (min) 38 min   ? Activity Tolerance Patient tolerated treatment well   ? Behavior During Therapy [facility] for tasks assessed/performed   ? ?  ?  ? ?  ? ? ?Past Medical History:  ?Diagnosis Date  ? Anemia   ? Breast cancer ([place])   ? Chronic kidney disease   ? Diabetes mellitus without complication ([place])   ? Family history of bladder cancer   ? Family history of breast cancer   ? Hypertension   ? Neuromuscular disorder ([place])   ? Tremor  ? Personal history of breast cancer   ? ? ?Past Surgical History:  ?Procedure Laterality Date  ? ABDOMINAL HYSTERECTOMY    ? Age 50s, for ectopic pregnancy  ? BREAST LUMPECTOMY Right 06/23/2004  ? pT1cN0M0 ER/PR + HER2 - stage IA  ? BREAST LUMPECTOMY Left 08/23/2018  ? pT2N0M0 ER/PR/HER2 neg stage IIA  ? ? ?There were no vitals filed for this visit. ? ? Subjective Assessment - 09/03/21 1152   ? ? Subjective Pateint reports no problems since last visit.   ? Pertinent History right breast cancer in 2007 with chemo, lumpectomy and radiation and arimedex . left breast cancer 06/2018 lumpectomy with post op swelling that opened up and drained , chemo and radiation,  Pt was treated for this in [place]. She moved to [facility] in Nov. 2021 Past history includes tremors of her hands left > right, IDDM, HTN, sometimes her right knee will "go out " her so she uses a quad cane   ? How  long can you sit comfortably? No pain in sitting.   ? Patient Stated Goals Be able to walk better and farther.   ? Currently in Pain? No/denies   ? Pain Onset More than a month ago   ? ?  ?  ? ?  ? ? ? ? ? ? ? ? ? ? ? ? ? ? ? ? ? ? ? ? [place] [facility] Adult PT Treatment/Exercise - 09/03/21 0001   ? ?  ? Knee/Hip Exercises: Standing  ? Heel Raises Both;1 set;20 reps   ? Other Standing Knee Exercises B side step onot and off of Airex pad using cane and CGA, 5 reps each direction. mild unsteadiness.   ? Other Standing Knee Exercises Alternating step taps 1 x 10 , 1 x 5 with 3 taps   ?  ? Knee/Hip Exercises: Seated  ? Long [employer] Strengthening;Both;1 set;10 reps   ? Long Arc Quad Limitations No weight after previous exercise fatigued her.   ? Ball Squeeze 2 x 10 reps, hold 3 sec.   ? Clamshell with Marga Hoots   ? Other Seated Knee/Hip Exercises Lift legs onto and off 6" box from either side, 3# weights.   ? ?  ?  ? ?  ? ? ? ? ? ? ? ? ? ? ? ?  PT Short Term Goals - 08/27/21 1039   ? ?  ? PT SHORT TERM GOAL #1  ? Title Patietn will be I iwth basic HEP   ? Time 2   ? Period Weeks   ? Status Achieved   ? Target Date 09/01/21   ? ?  ?  ? ?  ? ? ? ? PT Long Term Goals - 09/03/21 1226   ? ?  ? PT LONG TERM GOAL #1  ? Title I with final HEP   ? Baseline Updated HEp remains challenging.   ? Time 5   ? Period Weeks   ? Status New   ? Target Date 10/13/21   ?  ? PT LONG TERM GOAL #2  ? Title Increase BLE strength to at least 4/5   ? Baseline 4-/5-3+/5   ? Time 6   ? Period Weeks   ? Status On-going   ? Target Date 10/13/21   ?  ? PT LONG TERM GOAL #3  ? Title Perform 5 x STS in < 20 sec to demosntrate improved strength and balance.   ? Status Achieved   16.19  ?  ? PT LONG TERM GOAL #4  ? Title Patient will ambulate at least 200' with LRAD with no C/O pain, demosntrating stable, efficient gait.   ? Baseline 200' into [place], but still slow progression.   ? Time 6   ? Period Weeks   ? Status On-going   ? Target Date 10/15/21   ? ?  ?   ? ?  ? ? ? ? ? ? ? ? Plan - 09/03/21 1200   ? ? Clinical Impression Statement Patient reports no issues since last treatment. She continued to progress with strengthening activities in sit and stand to facilitate stability and decresaed knee pain. She verbalized understanding of treatment plan and feels she is getting stronger. Shr reports that today for the first itme in a long while she was able to walk into [place], stand in line, then walk out She had been using the cart.   ? Comorbidities bilateral breast cancer and treatment, DM, HTN hyperlipidemia, obesity, knee arthritis, bilateral hand tremors   ? Stability/Clinical Decision Making Stable/Uncomplicated   ? Clinical Decision Making Moderate   ? Rehab Potential Good   ? PT Frequency 2x / week   ? PT Duration 8 weeks   ? PT Treatment/Interventions Therapeutic activities;Therapeutic exercise;Patient/family education;ADLs/Self Care Home Management;Iontophoresis 48m/ml Dexamethasone;Gait training;Stair training;Moist Heat;Ultrasound;Electrical Stimulation;Cryotherapy;DME Instruction;Balance training;Neuromuscular re-education;Manual techniques;Energy conservation;Dry needling;Passive range of motion;Taping   ? PT Next Visit Plan Cont iwth current POC, progress as tolerated.   ? PT [place]  ? Consulted and Agree with Plan of Care Patient   ? ?  ?  ? ?  ? ? ?Patient will benefit from skilled therapeutic intervention in order to improve the following deficits and impairments:  Increased edema, Decreased strength, Decreased mobility, Decreased range of motion, Abnormal gait, Decreased coordination, Difficulty walking, Improper body mechanics, Impaired flexibility, Decreased balance, Pain ? ?Visit Diagnosis: ?Difficulty in walking, not elsewhere classified ? ?Left knee pain, unspecified chronicity ? ?Chronic pain of left knee ? ?Unsteadiness on feet ? ?Muscle weakness (generalized) ? ? ? ? ?Problem List ?Patient Active Problem List  ? Diagnosis  Date Noted  ? Genetic testing 11/13/2020  ? Family history of breast cancer   ? Family history of bladder cancer   ? Personal history of breast cancer   ? Malignant neoplasm  of left breast in female, estrogen receptor negative ([place]) 10/23/2020  ? Malignant neoplasm of right breast in female, estrogen receptor positive ([place]) 10/07/2020  ? Depression 10/07/2020  ? Diabetes mellitus ([place]) 10/07/2020  ? Hypercholesterolemia 10/07/2020  ? Hypertension 10/07/2020  ? Tremor of both hands 10/07/2020  ? History of gout 09/26/2020  ? ? ?Marcelina Morel, DPT ?09/03/2021, 12:27 PM ? ?[facility] ?[address] ?[address] ?[place], [place], [address] ?Phone: [phone]   Fax:  [phone] ? ?Name: Toni Stone ?MRN: [id number] ?Date of Birth: 04-Jan-1946 ? ? ? ?

## 2021-09-08 ENCOUNTER — Ambulatory Visit: Payer: Medicare HMO | Admitting: Physical Therapy

## 2021-09-08 ENCOUNTER — Encounter: Payer: Self-pay | Admitting: Physical Therapy

## 2021-09-08 ENCOUNTER — Other Ambulatory Visit: Payer: Self-pay

## 2021-09-08 DIAGNOSIS — R262 Difficulty in walking, not elsewhere classified: Secondary | ICD-10-CM | POA: Diagnosis not present

## 2021-09-08 DIAGNOSIS — M6281 Muscle weakness (generalized): Secondary | ICD-10-CM

## 2021-09-08 DIAGNOSIS — G8929 Other chronic pain: Secondary | ICD-10-CM

## 2021-09-08 DIAGNOSIS — M25562 Pain in left knee: Secondary | ICD-10-CM

## 2021-09-08 DIAGNOSIS — R2681 Unsteadiness on feet: Secondary | ICD-10-CM

## 2021-09-08 NOTE — Therapy (Signed)
[facility] [address] ?[place], [place], [address] ?Phone: [phone]   Fax:  [phone] ? ?Physical Therapy Treatment ? ?Patient Details  ?Name: Toni Stone ?MRN: [id number] ?Date of Birth: 1946/05/23 ?Referring Provider (PT): Toni Stone ? ? ?Encounter Date: 09/08/2021 ? ? PT End of Session - 09/08/21 1144   ? ? Visit Number 11   ? Date for PT Re-Evaluation 10/13/21   ? PT Start Time 1100   ? PT Stop Time 1145   ? PT Time Calculation (min) 45 min   ? Activity Tolerance Patient tolerated treatment well   ? Behavior During Therapy [facility] for tasks assessed/performed   ? ?  ?  ? ?  ? ? ?Past Medical History:  ?Diagnosis Date  ? Anemia   ? Breast cancer ([place])   ? Chronic kidney disease   ? Diabetes mellitus without complication ([place])   ? Family history of bladder cancer   ? Family history of breast cancer   ? Hypertension   ? Neuromuscular disorder ([place])   ? Tremor  ? Personal history of breast cancer   ? ? ?Past Surgical History:  ?Procedure Laterality Date  ? ABDOMINAL HYSTERECTOMY    ? Age 68s, for ectopic pregnancy  ? BREAST LUMPECTOMY Right 06/23/2004  ? pT1cN0M0 ER/PR + HER2 - stage IA  ? BREAST LUMPECTOMY Left 08/23/2018  ? pT2N0M0 ER/PR/HER2 neg stage IIA  ? ? ?There were no vitals filed for this visit. ? ? Subjective Assessment - 09/08/21 1102   ? ? Subjective "Ok"   ? Currently in Pain? No/denies   ? ?  ?  ? ?  ? ? ? ? ? ? ? ? ? ? ? ? ? ? ? ? ? ? ? ? [place] Adult PT Treatment/Exercise - 09/08/21 0001   ? ?  ? Ambulation/Gait  ? Stairs Yes   ?  ? High Level Balance  ? High Level Balance Activities Side stepping   ?  ? Knee/Hip Exercises: Aerobic  ? Nustep L4 x 5 minutes   ?  ? Knee/Hip Exercises: Standing  ? Other Standing Knee Exercises Marching 3lb w/ SBQC 2x10   ?  ? Knee/Hip Exercises: Seated  ? Long Arc Quad Strengthening;Both;10 reps;2 sets   ? Long Arc Quad Weight 3 lbs.   ? Ball Squeeze 2 x 10 reps, hold 3 sec.   ? Hamstring Curl Both;2 sets;10 reps    ? Hamstring Limitations green Tband   ? Sit to Sand 2 sets;10 reps;without UE support   ? ?  ?  ? ?  ? ? ? ? ? ? ? ? ? ? ? ? PT Short Term Goals - 08/27/21 1039   ? ?  ? PT SHORT TERM GOAL #1  ? Title Patietn will be I iwth basic HEP   ? Time 2   ? Period Weeks   ? Status Achieved   ? Target Date 09/01/21   ? ?  ?  ? ?  ? ? ? ? PT Long Term Goals - 09/03/21 1226   ? ?  ? PT LONG TERM GOAL #1  ? Title I with final HEP   ? Baseline Updated HEp remains challenging.   ? Time 5   ? Period Weeks   ? Status New   ? Target Date 10/13/21   ?  ? PT LONG TERM GOAL #2  ? Title Increase BLE strength to at least 4/5   ?  Baseline 4-/5-3+/5   ? Time 6   ? Period Weeks   ? Status On-going   ? Target Date 10/13/21   ?  ? PT LONG TERM GOAL #3  ? Title Perform 5 x STS in < 20 sec to demosntrate improved strength and balance.   ? Status Achieved   16.19  ?  ? PT LONG TERM GOAL #4  ? Title Patient will ambulate at least 200' with LRAD with no C/O pain, demosntrating stable, efficient gait.   ? Baseline 200' into [place], but still slow progression.   ? Time 6   ? Period Weeks   ? Status On-going   ? Target Date 10/15/21   ? ?  ?  ? ?  ? ? ? ? ? ? ? ? Plan - 09/08/21 1145   ? ? Clinical Impression Statement Pt enters clinic feeling well with no new issues. Some instability present today with alt box taps and side steps. Increase fatigue present with sit to stands. Cue needed to increase bilateral hip and knee flexion with standing marches.   ? Comorbidities bilateral breast cancer and treatment, DM, HTN hyperlipidemia, obesity, knee arthritis, bilateral hand tremors   ? Examination-Activity Limitations Locomotion Level;Transfers;Bed Mobility;Carry;Squat;Stairs;Stand;Lift   ? Examination-Participation Restrictions Church;Meal Prep;Cleaning;Community Activity;Driving   ? Rehab Potential Good   ? PT Frequency 2x / week   ? PT Duration 8 weeks   ? PT Treatment/Interventions Therapeutic activities;Therapeutic exercise;Patient/family  education;ADLs/Self Care Home Management;Iontophoresis 19m/ml Dexamethasone;Gait training;Stair training;Moist Heat;Ultrasound;Electrical Stimulation;Cryotherapy;DME Instruction;Balance training;Neuromuscular re-education;Manual techniques;Energy conservation;Dry needling;Passive range of motion;Taping   ? PT Next Visit Plan Cont iwth current POC, progress as tolerated.   ? ?  ?  ? ?  ? ? ?Patient will benefit from skilled therapeutic intervention in order to improve the following deficits and impairments:  Increased edema, Decreased strength, Decreased mobility, Decreased range of motion, Abnormal gait, Decreased coordination, Difficulty walking, Improper body mechanics, Impaired flexibility, Decreased balance, Pain ? ?Visit Diagnosis: ?Difficulty in walking, not elsewhere classified ? ?Unsteadiness on feet ? ?Chronic pain of left knee ? ?Muscle weakness (generalized) ? ?Left knee pain, unspecified chronicity ? ? ? ? ?Problem List ?Patient Active Problem List  ? Diagnosis Date Noted  ? Genetic testing 11/13/2020  ? Family history of breast cancer   ? Family history of bladder cancer   ? Personal history of breast cancer   ? Malignant neoplasm of left breast in female, estrogen receptor negative ([place] 10/23/2020  ? Malignant neoplasm of right breast in female, estrogen receptor positive ([facility] 10/07/2020  ? Depression 10/07/2020  ? Diabetes mellitus ([facility] 10/07/2020  ? Hypercholesterolemia 10/07/2020  ? Hypertension 10/07/2020  ? Tremor of both hands 10/07/2020  ? History of gout 09/26/2020  ? ? ?Toni Stone PTA ?09/08/2021, 11:47 AM ? ?[place] [address] ?[place] [address]?Phone: [phone]  Fax:  [phone]? ?Name: FRiann Stone?MRN: [id number]?Date of Birth: 807-27-1947? ? ? ?

## 2021-09-10 ENCOUNTER — Other Ambulatory Visit: Payer: Self-pay

## 2021-09-10 ENCOUNTER — Ambulatory Visit: Payer: Medicare HMO | Admitting: Physical Therapy

## 2021-09-10 ENCOUNTER — Encounter: Payer: Self-pay | Admitting: Physical Therapy

## 2021-09-10 DIAGNOSIS — R2681 Unsteadiness on feet: Secondary | ICD-10-CM

## 2021-09-10 DIAGNOSIS — R262 Difficulty in walking, not elsewhere classified: Secondary | ICD-10-CM

## 2021-09-10 DIAGNOSIS — M6281 Muscle weakness (generalized): Secondary | ICD-10-CM

## 2021-09-10 DIAGNOSIS — G8929 Other chronic pain: Secondary | ICD-10-CM

## 2021-09-10 NOTE — Therapy (Signed)
[place] [facility] [address] ?[place], [place], [address] ?Phone: [phone]   Fax:  [phone] ? ?Physical Therapy Treatment ? ?Patient Details  ?Name: Toni Stone ?MRN: [id number] ?Date of Birth: 1945/09/19 ?Referring Provider (PT): Eldridge Abrahams ? ? ?Encounter Date: 09/10/2021 ? ? PT End of Session - 09/10/21 1055   ? ? Visit Number 12   ? Date for PT Re-Evaluation 10/13/21   ? PT Start Time 1015   ? PT Stop Time 1055   ? PT Time Calculation (min) 40 min   ? Activity Tolerance Patient tolerated treatment well   ? Behavior During Therapy [facility] for tasks assessed/performed   ? ?  ?  ? ?  ? ? ?Past Medical History:  ?Diagnosis Date  ? Anemia   ? Breast cancer ([place])   ? Chronic kidney disease   ? Diabetes mellitus without complication ([place])   ? Family history of bladder cancer   ? Family history of breast cancer   ? Hypertension   ? Neuromuscular disorder ([place])   ? Tremor  ? Personal history of breast cancer   ? ? ?Past Surgical History:  ?Procedure Laterality Date  ? ABDOMINAL HYSTERECTOMY    ? Age 47s, for ectopic pregnancy  ? BREAST LUMPECTOMY Right 06/23/2004  ? pT1cN0M0 ER/PR + HER2 - stage IA  ? BREAST LUMPECTOMY Left 08/23/2018  ? pT2N0M0 ER/PR/HER2 neg stage IIA  ? ? ?There were no vitals filed for this visit. ? ? Subjective Assessment - 09/10/21 1020   ? ? Subjective Doing good   ? Currently in Pain? No/denies   ? ?  ?  ? ?  ? ? ? ? ? ? ? ? ? ? ? ? ? ? ? ? ? ? ? ? [place] Adult PT Treatment/Exercise - 09/10/21 0001   ? ?  ? Ambulation/Gait  ? Ambulation/Gait Yes   ? Ambulation/Gait Assistance 5: Supervision;4: Min guard   ? Ambulation Distance (Feet) 200 Feet   ? Assistive device Small based quad cane   ? Gait Pattern Step-through pattern;Decreased step length - right;Decreased step length - left;Decreased stance time - right;Decreased stance time - left;Decreased stride length;Lateral hip instability;Lateral trunk lean to right;Lateral trunk lean to left;Wide base  of support   ? Ambulation Surface Level;Indoor   ?  ? Knee/Hip Exercises: Aerobic  ? Nustep L4 x 6 minutes   ?  ? Knee/Hip Exercises: Machines for Strengthening  ? Cybex Knee Extension 5lb 2x10   ? Cybex Knee Flexion 20lb 2x10   ?  ? Knee/Hip Exercises: Standing  ? Other Standing Knee Exercises Alt box taps 4in 2lb w/ SBQC 2x10   ?  ? Knee/Hip Exercises: Seated  ? Sit to Sand 2 sets;10 reps;without UE support   ? ?  ?  ? ?  ? ? ? ? ? ? ? ? ? ? ? ? PT Short Term Goals - 08/27/21 1039   ? ?  ? PT SHORT TERM GOAL #1  ? Title Patietn will be I iwth basic HEP   ? Time 2   ? Period Weeks   ? Status Achieved   ? Target Date 09/01/21   ? ?  ?  ? ?  ? ? ? ? PT Long Term Goals - 09/10/21 1050   ? ?  ? PT LONG TERM GOAL #1  ? Title I with final HEP   ? Status Partially Met   ?  ? PT LONG TERM  GOAL #2  ? Title Increase BLE strength to at least 4/5   ? Status On-going   ?  ? PT LONG TERM GOAL #3  ? Title Perform 5 x STS in < 20 sec to demosntrate improved strength and balance.   ?  ? PT LONG TERM GOAL #4  ? Title Patient will ambulate at least 200' with LRAD with no C/O pain, demosntrating stable, efficient gait.   ? Status Partially Met   glow gait  ? ?  ?  ? ?  ? ? ? ? ? ? ? ? Plan - 09/10/21 1056   ? ? Clinical Impression Statement Again enters clinic feeling fine. Progressed to machine level interventions without issue. Pt demo ed some good stability with resisted alt box taps as reps progressed. Ambulated 200 feet, ut with slow gait speed. Increase fatigue with sit to stands.   ? Personal Factors and Comorbidities Comorbidity 3+   ? Comorbidities bilateral breast cancer and treatment, DM, HTN hyperlipidemia, obesity, knee arthritis, bilateral hand tremors   ? Examination-Activity Limitations Locomotion Level;Transfers;Bed Mobility;Carry;Squat;Stairs;Stand;Lift   ? Examination-Participation Restrictions Church;Meal Prep;Cleaning;Community Activity;Driving   ? Stability/Clinical Decision Making Stable/Uncomplicated   ? Rehab  Potential Good   ? PT Frequency 2x / week   ? PT Duration 8 weeks   ? PT Treatment/Interventions Therapeutic activities;Therapeutic exercise;Patient/family education;ADLs/Self Care Home Management;Iontophoresis 61m/ml Dexamethasone;Gait training;Stair training;Moist Heat;Ultrasound;Electrical Stimulation;Cryotherapy;DME Instruction;Balance training;Neuromuscular re-education;Manual techniques;Energy conservation;Dry needling;Passive range of motion;Taping   ? PT Next Visit Plan Cont iwth current POC, progress as tolerated.   ? ?  ?  ? ?  ? ? ?Patient will benefit from skilled therapeutic intervention in order to improve the following deficits and impairments:  Increased edema, Decreased strength, Decreased mobility, Decreased range of motion, Abnormal gait, Decreased coordination, Difficulty walking, Improper body mechanics, Impaired flexibility, Decreased balance, Pain ? ?Visit Diagnosis: ?Difficulty in walking, not elsewhere classified ? ?Unsteadiness on feet ? ?Chronic pain of left knee ? ?Muscle weakness (generalized) ? ? ? ? ?Problem List ?Patient Active Problem List  ? Diagnosis Date Noted  ? Genetic testing 11/13/2020  ? Family history of breast cancer   ? Family history of bladder cancer   ? Personal history of breast cancer   ? Malignant neoplasm of left breast in female, estrogen receptor negative ([place] 10/23/2020  ? Malignant neoplasm of right breast in female, estrogen receptor positive ([facility] 10/07/2020  ? Depression 10/07/2020  ? Diabetes mellitus ([place] 10/07/2020  ? Hypercholesterolemia 10/07/2020  ? Hypertension 10/07/2020  ? Tremor of both hands 10/07/2020  ? History of gout 09/26/2020  ? ? ?RScot Stone PTA ?09/10/2021, 10:58 AM ? ?[place] [address] ?[place] [address]?Phone: [phone]  Fax:  [phone]? ?Name: Toni Stone?MRN: [id number]?Date of Birth: [birth year]? ? ? ?

## 2021-09-14 ENCOUNTER — Other Ambulatory Visit: Payer: Self-pay

## 2021-09-14 ENCOUNTER — Encounter: Payer: Self-pay | Admitting: Physical Therapy

## 2021-09-14 ENCOUNTER — Ambulatory Visit: Payer: Medicare HMO | Admitting: Physical Therapy

## 2021-09-14 DIAGNOSIS — R2681 Unsteadiness on feet: Secondary | ICD-10-CM

## 2021-09-14 DIAGNOSIS — R262 Difficulty in walking, not elsewhere classified: Secondary | ICD-10-CM

## 2021-09-14 DIAGNOSIS — M6281 Muscle weakness (generalized): Secondary | ICD-10-CM

## 2021-09-14 DIAGNOSIS — M25562 Pain in left knee: Secondary | ICD-10-CM

## 2021-09-14 DIAGNOSIS — G8929 Other chronic pain: Secondary | ICD-10-CM

## 2021-09-14 NOTE — Therapy (Signed)
[place] ?[place] ?[address] ?[place], [place], [address] ?Phone: [phone]   Fax:  [phone] ? ?Physical Therapy Treatment ? ?Patient Details  ?Name: Toni Stone ?MRN: [id number] ?Date of Birth: [birth year] ?Referring Provider (PT): Toni Stone ? ? ?Encounter Date: 09/14/2021 ? ? PT End of Session - 09/14/21 1058   ? ? Visit Number 13   ? Number of Visits 20   ? Date for PT Re-Evaluation 10/13/21   ? PT Start Time 1015   ? PT Stop Time 1100   ? PT Time Calculation (min) 45 min   ? Activity Tolerance Patient tolerated treatment well   ? Behavior During Therapy [facility] for tasks assessed/performed   ? ?  ?  ? ?  ? ? ?Past Medical History:  ?Diagnosis Date  ? Anemia   ? Breast cancer ([place])   ? Chronic kidney disease   ? Diabetes mellitus without complication ([place])   ? Family history of bladder cancer   ? Family history of breast cancer   ? Hypertension   ? Neuromuscular disorder ([place])   ? Tremor  ? Personal history of breast cancer   ? ? ?Past Surgical History:  ?Procedure Laterality Date  ? ABDOMINAL HYSTERECTOMY    ? Age 70s, for ectopic pregnancy  ? BREAST LUMPECTOMY Right 06/23/2004  ? pT1cN0M0 ER/PR + HER2 - stage IA  ? BREAST LUMPECTOMY Left 08/23/2018  ? pT2N0M0 ER/PR/HER2 neg stage IIA  ? ? ?There were no vitals filed for this visit. ? ? Subjective Assessment - 09/14/21 1018   ? ? Subjective "I am doing good"   ? Currently in Pain? No/denies   ? ?  ?  ? ?  ? ? ? ? ? ? ? ? ? ? ? ? ? ? ? ? ? ? ? ? [place] Adult PT Treatment/Exercise - 09/14/21 0001   ? ?  ? Ambulation/Gait  ? Ambulation/Gait Yes   ? Ambulation/Gait Assistance 5: Supervision;4: Min guard   ? Ambulation Distance (Feet) 225 Feet   ? Assistive device Small based quad cane   ? Gait Pattern Step-through pattern;Decreased step length - right;Decreased step length - left;Decreased stance time - right;Decreased stance time - left;Decreased stride length;Lateral hip instability;Lateral trunk lean to right;Lateral  trunk lean to left;Wide base of support   ? Ambulation Surface Level;Indoor   ?  ? Knee/Hip Exercises: Aerobic  ? Nustep L4 x 6 minutes   ?  ? Knee/Hip Exercises: Machines for Strengthening  ? Cybex Knee Extension 5lb 2x10   ? Cybex Knee Flexion 20lb 2x10   ?  ? Knee/Hip Exercises: Standing  ? Other Standing Knee Exercises Alt box taps 4in 2lb w/ SBQC 2x10   ?  ? Knee/Hip Exercises: Seated  ? Sit to Sand 2 sets;10 reps;without UE support   ?  ? Knee/Hip Exercises: Supine  ? Quad Sets --   holding yellow ball  ? ?  ?  ? ?  ? ? ? ? ? ? ? ? ? ? ? ? PT Short Term Goals - 08/27/21 1039   ? ?  ? PT SHORT TERM GOAL #1  ? Title Patietn will be I iwth basic HEP   ? Time 2   ? Period Weeks   ? Status Achieved   ? Target Date 09/01/21   ? ?  ?  ? ?  ? ? ? ? PT Long Term Goals - 09/14/21 1048   ? ?  ?  PT LONG TERM GOAL #1  ? Title I with final HEP   ? Status Achieved   ?  ? PT LONG TERM GOAL #2  ? Title Increase BLE strength to at least 4/5   ? Status On-going   ?  ? PT LONG TERM GOAL #3  ? Title Perform 5 x STS in < 20 sec to demosntrate improved strength and balance.   ? Status Achieved   ?  ? PT LONG TERM GOAL #4  ? Title Patient will ambulate at least 200' with LRAD with no C/O pain, demosntrating stable, efficient gait.   ? Status Partially Met   ? ?  ?  ? ?  ? ? ? ? ? ? ? ? Plan - 09/14/21 1059   ? ? Clinical Impression Statement Good carryover form last session with machine level interventions. Pt able to complete sit to stand with weighted ball but did have some difficulty initially. Cues to increase gait speed during gait trial. No reports of increase pain but doe fatigue with functional interventions.   ? Comorbidities bilateral breast cancer and treatment, DM, HTN hyperlipidemia, obesity, knee arthritis, bilateral hand tremors   ? Examination-Activity Limitations Locomotion Level;Transfers;Bed Mobility;Carry;Squat;Stairs;Stand;Lift   ? Examination-Participation Restrictions Church;Meal Prep;Cleaning;Community  Activity;Driving   ? Rehab Potential Good   ? PT Frequency 2x / week   ? PT Duration 8 weeks   ? PT Treatment/Interventions Therapeutic activities;Therapeutic exercise;Patient/family education;ADLs/Self Care Home Management;Iontophoresis 42m/ml Dexamethasone;Gait training;Stair training;Moist Heat;Ultrasound;Electrical Stimulation;Cryotherapy;DME Instruction;Balance training;Neuromuscular re-education;Manual techniques;Energy conservation;Dry needling;Passive range of motion;Taping   ? PT Next Visit Plan Cont iwth current POC, progress as tolerated.   ? ?  ?  ? ?  ? ? ?Patient will benefit from skilled therapeutic intervention in order to improve the following deficits and impairments:  Increased edema, Decreased strength, Decreased mobility, Decreased range of motion, Abnormal gait, Decreased coordination, Difficulty walking, Improper body mechanics, Impaired flexibility, Decreased balance, Pain ? ?Visit Diagnosis: ?Difficulty in walking, not elsewhere classified ? ?Chronic pain of left knee ? ?Muscle weakness (generalized) ? ?Unsteadiness on feet ? ?Left knee pain, unspecified chronicity ? ? ? ? ?Problem List ?Patient Active Problem List  ? Diagnosis Date Noted  ? Genetic testing 11/13/2020  ? Family history of breast cancer   ? Family history of bladder cancer   ? Personal history of breast cancer   ? Malignant neoplasm of left breast in female, estrogen receptor negative ([facility] 10/23/2020  ? Malignant neoplasm of right breast in female, estrogen receptor positive ([facility] 10/07/2020  ? Depression 10/07/2020  ? Diabetes mellitus ([place] 10/07/2020  ? Hypercholesterolemia 10/07/2020  ? Hypertension 10/07/2020  ? Tremor of both hands 10/07/2020  ? History of gout 09/26/2020  ? ? ?[name] ?09/14/2021, 11:01 AM ? ?[place] ?[address] ?[place] [address]?Phone: [phone]  Fax:  [phone]? ?Name: FNila Stone?MRN: [id number]?Date of  Birth: 805-24-1947? ? ? ?

## 2021-09-17 ENCOUNTER — Ambulatory Visit: Payer: Medicare HMO | Admitting: Physical Therapy

## 2021-09-17 ENCOUNTER — Encounter: Payer: Self-pay | Admitting: Physical Therapy

## 2021-09-17 DIAGNOSIS — R262 Difficulty in walking, not elsewhere classified: Secondary | ICD-10-CM | POA: Diagnosis not present

## 2021-09-17 DIAGNOSIS — M25562 Pain in left knee: Secondary | ICD-10-CM

## 2021-09-17 DIAGNOSIS — R2681 Unsteadiness on feet: Secondary | ICD-10-CM

## 2021-09-17 DIAGNOSIS — M6281 Muscle weakness (generalized): Secondary | ICD-10-CM

## 2021-09-17 DIAGNOSIS — G8929 Other chronic pain: Secondary | ICD-10-CM

## 2021-09-17 NOTE — Therapy (Signed)
[place] [facility] [address] ?[place], [place], [address] ?Phone: [phone]   Fax:  [phone] ? ?Physical Therapy Treatment ? ?Patient Details  ?Name: Toni Stone ?MRN: [id number] ?Date of Birth: 1945/08/28 ?Referring Provider (PT): Eldridge Abrahams ? ? ?Encounter Date: 09/17/2021 ? ? PT End of Session - 09/17/21 1433   ? ? Visit Number 14   ? PT Start Time 1401   ? PT Stop Time 6073   ? PT Time Calculation (min) 38 min   ? Activity Tolerance Patient tolerated treatment well   ? Behavior During Therapy [facility] for tasks assessed/performed   ? ?  ?  ? ?  ? ? ?Past Medical History:  ?Diagnosis Date  ? Anemia   ? Breast cancer ([place])   ? Chronic kidney disease   ? Diabetes mellitus without complication ([place])   ? Family history of bladder cancer   ? Family history of breast cancer   ? Hypertension   ? Neuromuscular disorder ([place])   ? Tremor  ? Personal history of breast cancer   ? ? ?Past Surgical History:  ?Procedure Laterality Date  ? ABDOMINAL HYSTERECTOMY    ? Age 51s, for ectopic pregnancy  ? BREAST LUMPECTOMY Right 06/23/2004  ? pT1cN0M0 ER/PR + HER2 - stage IA  ? BREAST LUMPECTOMY Left 08/23/2018  ? pT2N0M0 ER/PR/HER2 neg stage IIA  ? ? ?There were no vitals filed for this visit. ? ? Subjective Assessment - 09/17/21 1405   ? ? Subjective Patient reprots her knee remain stiff. She feels like she is getting stronger overall. She starts with some stretches in the bed before she even gets up.   ? Pertinent History right breast cancer in 2007 with chemo, lumpectomy and radiation and arimedex . left breast cancer 06/2018 lumpectomy with post op swelling that opened up and drained , chemo and radiation,  [place] [facility] [place] Past history includes tremors of her hands left > right, IDDM, HTN, sometimes her right knee will "go out " her so she uses a quad cane   ? Currently in Pain? No/denies   ? ?  ?  ? ?   ? ? ? ? ? ? ? ? ? ? ? ? ? ? ? ? ? ? ? ? [place] Adult PT Treatment/Exercise - 09/17/21 0001   ? ?  ? Knee/Hip Exercises: Aerobic  ? Nustep L4 x 6 minutes   ?  ? Knee/Hip Exercises: Seated  ? Long [employer] Strengthening;Both;2 sets;10 reps;Other (comment)   Yellow Tband resistance wrapped around ankles.  ? Clamshell with TheraBand Yellow   2 x 10 reps  ? Other Seated Knee/Hip Exercises Lateral lean and return to center, with weight shfit over BOS, 10 reps to each side.   ? Hamstring Curl Both;2 sets;10 reps   ? Hamstring Limitations yellow Tband   ? Sit to Sand 1 set;without UE support   Holding 1# object in BUE, from elevated surface  ? ?  ?  ? ?  ? ? ? ? ? ? ? ? ? ? ? ? PT Short Term Goals - 08/27/21 1039   ? ?  ? PT SHORT TERM GOAL #1  ? Title Patietn will be I iwth basic HEP   ? Time 2   ? Period Weeks   ? Status Achieved   ? Target Date 09/01/21   ? ?  ?  ? ?  ? ? ? ?  PT Long Term Goals - 09/14/21 1048   ? ?  ? PT LONG TERM GOAL #1  ? Title I with final HEP   ? Status Achieved   ?  ? PT LONG TERM GOAL #2  ? Title Increase BLE strength to at least 4/5   ? Status On-going   ?  ? PT LONG TERM GOAL #3  ? Title Perform 5 x STS in < 20 sec to demosntrate improved strength and balance.   ? Status Achieved   ?  ? PT LONG TERM GOAL #4  ? Title Patient will ambulate at least 200' with LRAD with no C/O pain, demosntrating stable, efficient gait.   ? Status Partially Met   ? ?  ?  ? ?  ? ? ? ? ? ? ? ? Plan - 09/17/21 1411   ? ? Clinical Impression Statement Patietn reports that her nephew commented on her improved strength the other day when she was able to climb into his car, with help. She feels the treatemnt activities are helping. She is concerned about being able to continue to exercise after she is D/C'd. she does not have access to a gym at home. Treatment focused on strengthening activities in sit and stand.   ? Comorbidities bilateral breast cancer and treatment, DM, HTN hyperlipidemia, obesity, knee arthritis,  bilateral hand tremors   ? Examination-Activity Limitations Locomotion Level;Transfers;Bed Mobility;Carry;Squat;Stairs;Stand;Lift   ? Stability/Clinical Decision Making Stable/Uncomplicated   ? Clinical Decision Making Moderate   ? Rehab Potential Good   ? PT Frequency 2x / week   ? PT Duration 8 weeks   ? PT Treatment/Interventions Therapeutic activities;Therapeutic exercise;Patient/family education;ADLs/Self Care Home Management;Iontophoresis 84m/ml Dexamethasone;Gait training;Stair training;Moist Heat;Ultrasound;Electrical Stimulation;Cryotherapy;DME Instruction;Balance training;Neuromuscular re-education;Manual techniques;Energy conservation;Dry needling;Passive range of motion;Taping   ? PT Next Visit Plan Cont iwth current POC, progress as tolerated. Identify appropriate exercises for HEP.   ? PT [place]  ? Consulted and Agree with Plan of Care Patient   ? ?  ?  ? ?  ? ? ?Patient will benefit from skilled therapeutic intervention in order to improve the following deficits and impairments:  Increased edema, Decreased strength, Decreased mobility, Decreased range of motion, Abnormal gait, Decreased coordination, Difficulty walking, Improper body mechanics, Impaired flexibility, Decreased balance, Pain ? ?Visit Diagnosis: ?Difficulty in walking, not elsewhere classified ? ?Chronic pain of left knee ? ?Muscle weakness (generalized) ? ?Unsteadiness on feet ? ?Left knee pain, unspecified chronicity ? ? ? ? ?Problem List ?Patient Active Problem List  ? Diagnosis Date Noted  ? Genetic testing 11/13/2020  ? Family history of breast cancer   ? Family history of bladder cancer   ? Personal history of breast cancer   ? Malignant neoplasm of left breast in female, estrogen receptor negative ([facility] 10/23/2020  ? Malignant neoplasm of right breast in female, estrogen receptor positive ([facility] [place] 10/07/2020  ? Depression 10/07/2020  ? Diabetes mellitus ([facility] 10/07/2020  ? Hypercholesterolemia 10/07/2020  ?  Hypertension 10/07/2020  ? Tremor of both hands 10/07/2020  ? History of gout 09/26/2020  ? ? ?SMarcelina Morel DPT ?09/17/2021, 2:39 PM ? ?[place] ?[address] ?[place] [address]?Phone: [phone]  Fax:  [phone]? ?Name: FNaomie Crow?MRN: [id number]?Date of Birth: [birth year]? ? ? ?

## 2021-09-21 ENCOUNTER — Ambulatory Visit: Payer: Medicare HMO | Attending: Nurse Practitioner | Admitting: Physical Therapy

## 2021-09-21 ENCOUNTER — Encounter: Payer: Self-pay | Admitting: Physical Therapy

## 2021-09-21 DIAGNOSIS — M6281 Muscle weakness (generalized): Secondary | ICD-10-CM | POA: Diagnosis present

## 2021-09-21 DIAGNOSIS — G8929 Other chronic pain: Secondary | ICD-10-CM

## 2021-09-21 DIAGNOSIS — M25611 Stiffness of right shoulder, not elsewhere classified: Secondary | ICD-10-CM | POA: Diagnosis present

## 2021-09-21 DIAGNOSIS — M25562 Pain in left knee: Secondary | ICD-10-CM

## 2021-09-21 DIAGNOSIS — R2681 Unsteadiness on feet: Secondary | ICD-10-CM | POA: Diagnosis present

## 2021-09-21 DIAGNOSIS — R262 Difficulty in walking, not elsewhere classified: Secondary | ICD-10-CM | POA: Diagnosis present

## 2021-09-21 DIAGNOSIS — M25612 Stiffness of left shoulder, not elsewhere classified: Secondary | ICD-10-CM | POA: Insufficient documentation

## 2021-09-21 NOTE — Therapy (Signed)
[facility] ?[place] ?[address] ?[place], [place], [address] ?Phone: [phone]   Fax:  [phone] ? ?Physical Therapy Treatment ? ?Patient Details  ?Name: Toni Stone ?MRN: [id number] ?Date of Birth: 1945/09/22 ?Referring Provider (PT): Eldridge Abrahams ? ? ?Encounter Date: 09/21/2021 ? ? PT End of Session - 09/21/21 1234   ? ? Visit Number 15   ? Date for PT Re-Evaluation 10/13/21   ? PT Start Time 1227   ? PT Stop Time 1311   ? PT Time Calculation (min) 44 min   ? Activity Tolerance Patient tolerated treatment well   ? Behavior During Therapy [facility] for tasks assessed/performed   ? ?  ?  ? ?  ? ? ?Past Medical History:  ?Diagnosis Date  ? Anemia   ? Breast cancer ([place])   ? Chronic kidney disease   ? Diabetes mellitus without complication ([place])   ? Family history of bladder cancer   ? Family history of breast cancer   ? Hypertension   ? Neuromuscular disorder ([place])   ? Tremor  ? Personal history of breast cancer   ? ? ?Past Surgical History:  ?Procedure Laterality Date  ? ABDOMINAL HYSTERECTOMY    ? Age 39s, for ectopic pregnancy  ? BREAST LUMPECTOMY Right 06/23/2004  ? pT1cN0M0 ER/PR + HER2 - stage IA  ? BREAST LUMPECTOMY Left 08/23/2018  ? pT2N0M0 ER/PR/HER2 neg stage IIA  ? ? ?There were no vitals filed for this visit. ? ? Subjective Assessment - 09/21/21 1230   ? ? Subjective Patient reports no change in her status today. She is performing her HEP, but not daily. She does perform exercises in sitting frequently.   ? Pertinent History right breast cancer in 2007 with chemo, lumpectomy and radiation and arimedex . left breast cancer 06/2018 lumpectomy with post op swelling that opened up and drained , chemo and radiation,  Pt was treated for this in [place]. She moved to [facility] in Nov. 2021 Past history includes tremors of her hands left > right, IDDM, HTN, sometimes her right knee will "go out " her so she uses a quad cane   ? Currently in Pain? No/denies   ? ?  ?  ? ?   ? ? ? ? ? ? ? ? ? ? ? ? ? ? ? ? ? ? ? ? [place] Adult PT Treatment/Exercise - 09/21/21 0001   ? ?  ? Knee/Hip Exercises: Aerobic  ? Nustep L4 x 6 minutes   ?  ? Knee/Hip Exercises: Standing  ? Heel Raises Both;2 sets;10 reps   ? Heel Raises Limitations BUE support   ? Hip Abduction Stengthening;Both;2 sets;10 reps   BUE support  ? Hip Extension Stengthening;Both;2 sets;10 reps   ? Forward Step Up Both;1 set;5 reps   ? Forward Step Up Limitations onto/off Airex pad, with cane and CGa   ? Other Standing Knee Exercises Shoulder ext against Red Tband resistance, 2 x 10 reps. Attemtpedt rows, but patient could not grasp band securely.   ?  ? Knee/Hip Exercises: Seated  ? Sit to Sand 2 sets;10 reps;without UE support   on knees  ? ?  ?  ? ?  ? ? ? ? ? ? ? ? ? ? ? ? PT Short Term Goals - 08/27/21 1039   ? ?  ? PT SHORT TERM GOAL #1  ? Title Patietn will be I iwth basic HEP   ? Time 2   ?  Period Weeks   ? Status Achieved   ? Target Date 09/01/21   ? ?  ?  ? ?  ? ? ? ? PT Long Term Goals - 09/21/21 1240   ? ?  ? PT LONG TERM GOAL #1  ? Title I with final HEP   ? Status On-going   ?  ? PT LONG TERM GOAL #2  ? Title Increase BLE strength to at least 4/5   ? Status On-going   ? Target Date 10/13/21   ?  ? PT LONG TERM GOAL #3  ? Title Perform 5 x STS in < 20 sec to demosntrate improved strength and balance.   ? Status Achieved   ?  ? PT LONG TERM GOAL #4  ? Title Patient will ambulate at least 200' with LRAD with no C/O pain, demosntrating stable, efficient gait.   ? Status On-going   ? Target Date 10/13/21   ? ?  ?  ? ?  ? ? ? ? ? ? ? ? Plan - 09/21/21 1303   ? ? Clinical Impression Statement Patient reports no changes. She is not consistently performing her HEP exercises which require standing-reviewed them and she performed each without difficulty. Encouraged her to perform 1-2 standing exercises each day and cycle through the 4 exercises.   ? Comorbidities bilateral breast cancer and treatment, DM, HTN hyperlipidemia,  obesity, knee arthritis, bilateral hand tremors   ? Examination-Activity Limitations Locomotion Level;Transfers;Bed Mobility;Carry;Squat;Stairs;Stand;Lift   ? Examination-Participation Restrictions Church;Meal Prep;Cleaning;Community Activity;Driving   ? Stability/Clinical Decision Making Stable/Uncomplicated   ? Clinical Decision Making Moderate   ? Rehab Potential Good   ? PT Frequency 2x / week   ? PT Duration 3 weeks   ? PT Treatment/Interventions Therapeutic activities;Therapeutic exercise;Patient/family education;ADLs/Self Care Home Management;Iontophoresis 28m/ml Dexamethasone;Gait training;Stair training;Moist Heat;Ultrasound;Electrical Stimulation;Cryotherapy;DME Instruction;Balance training;Neuromuscular re-education;Manual techniques;Energy conservation;Dry needling;Passive range of motion;Taping   ? PT Next Visit Plan Progress strengthening as tolerated.   ? PT [place]  ? Consulted and Agree with Plan of Care Patient   ? ?  ?  ? ?  ? ? ?Patient will benefit from skilled therapeutic intervention in order to improve the following deficits and impairments:  Increased edema, Decreased strength, Decreased mobility, Decreased range of motion, Abnormal gait, Decreased coordination, Difficulty walking, Improper body mechanics, Impaired flexibility, Decreased balance, Pain ? ?Visit Diagnosis: ?Difficulty in walking, not elsewhere classified ? ?Chronic pain of left knee ? ?Muscle weakness (generalized) ? ?Unsteadiness on feet ? ?Left knee pain, unspecified chronicity ? ? ? ? ?Problem List ?Patient Active Problem List  ? Diagnosis Date Noted  ? Genetic testing 11/13/2020  ? Family history of breast cancer   ? Family history of bladder cancer   ? Personal history of breast cancer   ? Malignant neoplasm of left breast in female, estrogen receptor negative ([place] 10/23/2020  ? Malignant neoplasm of right breast in female, estrogen receptor positive ([place] 10/07/2020  ? Depression 10/07/2020  ?  Diabetes mellitus ([place] 10/07/2020  ? Hypercholesterolemia 10/07/2020  ? Hypertension 10/07/2020  ? Tremor of both hands 10/07/2020  ? History of gout 09/26/2020  ? ? ?SMarcelina Morel DPT ?09/21/2021, 1:49 PM ? ?[place] ?[address] ?[place] [address]?Phone: [phone]  Fax:  [phone]? ?Name: FAngely Stone?MRN: [id number]?Date of Birth: 805-23-1947? ? ? ?

## 2021-09-22 ENCOUNTER — Ambulatory Visit: Payer: Medicare HMO | Admitting: Physical Therapy

## 2021-09-22 ENCOUNTER — Encounter: Payer: Self-pay | Admitting: Physical Therapy

## 2021-09-22 DIAGNOSIS — M6281 Muscle weakness (generalized): Secondary | ICD-10-CM

## 2021-09-22 DIAGNOSIS — R2681 Unsteadiness on feet: Secondary | ICD-10-CM

## 2021-09-22 DIAGNOSIS — R262 Difficulty in walking, not elsewhere classified: Secondary | ICD-10-CM | POA: Diagnosis not present

## 2021-09-22 DIAGNOSIS — G8929 Other chronic pain: Secondary | ICD-10-CM

## 2021-09-22 NOTE — Therapy (Signed)
[facility] ?[place] [address] ?[place], [place], [address] ?Phone: [phone]   Fax:  [phone] ? ?Physical Therapy Treatment ? ?Patient Details  ?Name: Toni Stone ?MRN: [id number] ?Date of Birth: 06/18/1946 ?Referring Provider (PT): Eldridge Abrahams ? ? ?Encounter Date: 09/22/2021 ? ? PT End of Session - 09/22/21 1014   ? ? Visit Number 16   ? Date for PT Re-Evaluation 10/13/21   ? PT Start Time 0930   ? PT Stop Time 1015   ? PT Time Calculation (min) 45 min   ? Activity Tolerance Patient tolerated treatment well   ? Behavior During Therapy [facility] for tasks assessed/performed   ? ?  ?  ? ?  ? ? ?Past Medical History:  ?Diagnosis Date  ? Anemia   ? Breast cancer ([place])   ? Chronic kidney disease   ? Diabetes mellitus without complication ([place])   ? Family history of bladder cancer   ? Family history of breast cancer   ? Hypertension   ? Neuromuscular disorder ([place])   ? Tremor  ? Personal history of breast cancer   ? ? ?Past Surgical History:  ?Procedure Laterality Date  ? ABDOMINAL HYSTERECTOMY    ? Age 40s, for ectopic pregnancy  ? BREAST LUMPECTOMY Right 06/23/2004  ? pT1cN0M0 ER/PR + HER2 - stage IA  ? BREAST LUMPECTOMY Left 08/23/2018  ? pT2N0M0 ER/PR/HER2 neg stage IIA  ? ? ?There were no vitals filed for this visit. ? ? Subjective Assessment - 09/22/21 0931   ? ? Subjective "I feel good" Can feel the standing things form yesterday   ? Currently in Pain? No/denies   ? ?  ?  ? ?  ? ? ? ? ? ? ? ? ? ? ? ? ? ? ? ? ? ? ? ? [place] Adult PT Treatment/Exercise - 09/22/21 0001   ? ?  ? Knee/Hip Exercises: Aerobic  ? Nustep L5 x 6 minutes   ?  ? Knee/Hip Exercises: Machines for Strengthening  ? Cybex Knee Extension 5lb 2x10   ? Cybex Knee Flexion 20lb 2x10   ?  ? Knee/Hip Exercises: Standing  ? Hip Abduction Stengthening;Both;2 sets;10 reps   ? Abduction Limitations 2   ? Hip Extension Stengthening;Both;2 sets;10 reps   ? Extension Limitations 2   ? Forward Step Up Both;1 set;5 reps    ? Forward Step Up Limitations onto/off Airex pad, with cane and CGa   ? Other Standing Knee Exercises Shoulder ext & Rows against Red Tband resistance, 2 x 10 reps.   ?  ? Knee/Hip Exercises: Seated  ? Sit to Sand 2 sets;10 reps;without UE support   hands on knees  ? ?  ?  ? ?  ? ? ? ? ? ? ? ? ? ? ? ? PT Short Term Goals - 08/27/21 1039   ? ?  ? PT SHORT TERM GOAL #1  ? Title Patietn will be I iwth basic HEP   ? Time 2   ? Period Weeks   ? Status Achieved   ? Target Date 09/01/21   ? ?  ?  ? ?  ? ? ? ? PT Long Term Goals - 09/21/21 1240   ? ?  ? PT LONG TERM GOAL #1  ? Title I with final HEP   ? Status On-going   ?  ? PT LONG TERM GOAL #2  ? Title Increase BLE strength to at least 4/5   ?  Status On-going   ? Target Date 10/13/21   ?  ? PT LONG TERM GOAL #3  ? Title Perform 5 x STS in < 20 sec to demosntrate improved strength and balance.   ? Status Achieved   ?  ? PT LONG TERM GOAL #4  ? Title Patient will ambulate at least 200' with LRAD with no C/O pain, demosntrating stable, efficient gait.   ? Status On-going   ? Target Date 10/13/21   ? ?  ?  ? ?  ? ? ? ? ? ? ? ? Plan - 09/22/21 1014   ? ? Clinical Impression Statement Pt demos good carryover form last session with standing interventions. Postural cue required with standing shoulder extensions. UE assist needed on knee to complete sit to stands. Some compensation required with step ups,   ? Personal Factors and Comorbidities Comorbidity 3+   ? Comorbidities bilateral breast cancer and treatment, DM, HTN hyperlipidemia, obesity, knee arthritis, bilateral hand tremors   ? Examination-Activity Limitations Locomotion Level;Transfers;Bed Mobility;Carry;Squat;Stairs;Stand;Lift   ? Examination-Participation Restrictions Church;Meal Prep;Cleaning;Community Activity;Driving   ? Rehab Potential Good   ? PT Frequency 2x / week   ? PT Duration 3 weeks   ? PT Treatment/Interventions Therapeutic activities;Therapeutic exercise;Patient/family education;ADLs/Self Care Home  Management;Iontophoresis 19m/ml Dexamethasone;Gait training;Stair training;Moist Heat;Ultrasound;Electrical Stimulation;Cryotherapy;DME Instruction;Balance training;Neuromuscular re-education;Manual techniques;Energy conservation;Dry needling;Passive range of motion;Taping   ? PT Next Visit Plan Progress strengthening as tolerated.   ? ?  ?  ? ?  ? ? ?Patient will benefit from skilled therapeutic intervention in order to improve the following deficits and impairments:  Increased edema, Decreased strength, Decreased mobility, Decreased range of motion, Abnormal gait, Decreased coordination, Difficulty walking, Improper body mechanics, Impaired flexibility, Decreased balance, Pain ? ?Visit Diagnosis: ?Difficulty in walking, not elsewhere classified ? ?Chronic pain of left knee ? ?Muscle weakness (generalized) ? ?Unsteadiness on feet ? ? ? ? ?Problem List ?Patient Active Problem List  ? Diagnosis Date Noted  ? Genetic testing 11/13/2020  ? Family history of breast cancer   ? Family history of bladder cancer   ? Personal history of breast cancer   ? Malignant neoplasm of left breast in female, estrogen receptor negative ([place] 10/23/2020  ? Malignant neoplasm of right breast in female, estrogen receptor positive ([place] 10/07/2020  ? Depression 10/07/2020  ? Diabetes mellitus ([place] 10/07/2020  ? Hypercholesterolemia 10/07/2020  ? Hypertension 10/07/2020  ? Tremor of both hands 10/07/2020  ? History of gout 09/26/2020  ? ? ?[name] ?09/22/2021, 10:16 AM ? ?[place] ?[address] ?[place] [address]?Phone: [phone]  Fax:  [phone]? ?Name: FAudia Amick?MRN: [id number]?Date of Birth: 806-26-1947? ? ? ?

## 2021-09-24 ENCOUNTER — Ambulatory Visit: Payer: Medicare HMO | Admitting: Physical Therapy

## 2021-09-28 ENCOUNTER — Encounter: Payer: Self-pay | Admitting: Physical Therapy

## 2021-09-28 ENCOUNTER — Ambulatory Visit: Payer: Medicare HMO | Admitting: Physical Therapy

## 2021-09-28 DIAGNOSIS — G8929 Other chronic pain: Secondary | ICD-10-CM

## 2021-09-28 DIAGNOSIS — M25562 Pain in left knee: Secondary | ICD-10-CM

## 2021-09-28 DIAGNOSIS — R2681 Unsteadiness on feet: Secondary | ICD-10-CM

## 2021-09-28 DIAGNOSIS — R262 Difficulty in walking, not elsewhere classified: Secondary | ICD-10-CM

## 2021-09-28 DIAGNOSIS — M6281 Muscle weakness (generalized): Secondary | ICD-10-CM

## 2021-09-28 NOTE — Therapy (Signed)
[facility] ?[place] ?[address] ?[place], [place], [address] ?Phone: [phone]   Fax:  [phone] ? ?Physical Therapy Treatment ? ?Patient Details  ?Name: Toni Stone ?MRN: [id number] ?Date of Birth: Jan 19, 1946 ?Referring Provider (PT): Eldridge Abrahams ? ? ?Encounter Date: 09/28/2021 ? ? PT End of Session - 09/28/21 1140   ? ? Visit Number 17   ? Date for PT Re-Evaluation 10/13/21   ? PT Start Time 1107   ? PT Stop Time 1145   ? PT Time Calculation (min) 38 min   ? Activity Tolerance Patient tolerated treatment well   ? Behavior During Therapy [facility] for tasks assessed/performed   ? ?  ?  ? ?  ? ? ?Past Medical History:  ?Diagnosis Date  ? Anemia   ? Breast cancer ([place])   ? Chronic kidney disease   ? Diabetes mellitus without complication ([place])   ? Family history of bladder cancer   ? Family history of breast cancer   ? Hypertension   ? Neuromuscular disorder ([place])   ? Tremor  ? Personal history of breast cancer   ? ? ?Past Surgical History:  ?Procedure Laterality Date  ? ABDOMINAL HYSTERECTOMY    ? Age 76s, for ectopic pregnancy  ? BREAST LUMPECTOMY Right 06/23/2004  ? pT1cN0M0 ER/PR + HER2 - stage IA  ? BREAST LUMPECTOMY Left 08/23/2018  ? pT2N0M0 ER/PR/HER2 neg stage IIA  ? ? ?There were no vitals filed for this visit. ? ? Subjective Assessment - 09/28/21 1108   ? ? Subjective Patient reports she did a lot of walking over the weekend, but did not do her HEP. Her knees tolerated the walking well. She did do her HEP duirng the week. the LLE had difficulty holding her up while moving the RLE.   ? Pertinent History right breast cancer in 2007 with chemo, lumpectomy and radiation and arimedex . left breast cancer 06/2018 lumpectomy with post op swelling that opened up and drained , chemo and radiation,  Pt was treated for this in [place]. She moved to [facility] in Nov. 2021 Past history includes tremors of her hands left > right, IDDM, HTN, sometimes her right knee will "go  out " her so she uses a quad cane   ? Currently in Pain? No/denies   ? ?  ?  ? ?  ? ? ? ? ? ? ? ? ? ? ? ? ? ? ? ? ? ? ? ? [place] Adult PT Treatment/Exercise - 09/28/21 0001   ? ?  ? Knee/Hip Exercises: Aerobic  ? Nustep L4 x 6 minutes, U and LE   ?  ? Knee/Hip Exercises: Standing  ? Other Standing Knee Exercises B side stepping 4 x 4 steps in each direction, with BUE support for safety with L knee. Repeated with backwards walking x 4 reps, using cane in LUE and counter on R.   ?  ? Knee/Hip Exercises: Supine  ? Bridges with Clamshell Strengthening;Both;1 set;10 reps   red theraband.  ? Straight Leg Raises Strengthening;Both;1 set;10 reps   ? Straight Leg Raise with External Rotation Limitations 5 reps each   ? ?  ?  ? ?  ? ? ? ? ? ? ? ? ? ? ? ? PT Short Term Goals - 08/27/21 1039   ? ?  ? PT SHORT TERM GOAL #1  ? Title Patietn will be I iwth basic HEP   ? Time 2   ? Period  Weeks   ? Status Achieved   ? Target Date 09/01/21   ? ?  ?  ? ?  ? ? ? ? PT Long Term Goals - 09/28/21 1135   ? ?  ? PT LONG TERM GOAL #1  ? Title I with final HEP   ? Time 2   ? Period Weeks   ? Status On-going   ? Target Date 10/13/21   ?  ? PT LONG TERM GOAL #2  ? Title Increase BLE strength to at least 4/5   ? Time 2   ? Period Weeks   ? Status On-going   ? Target Date 10/13/21   ?  ? PT LONG TERM GOAL #3  ? Title Perform 5 x STS in < 20 sec to demosntrate improved strength and balance.   ? Status Achieved   ?  ? PT LONG TERM GOAL #4  ? Title Patient will ambulate at least 200' with LRAD with no C/O pain, demosntrating stable, efficient gait.   ? Time 2   ? Period Weeks   ? Status On-going   ? Target Date 10/13/21   ? ?  ?  ? ?  ? ? ? ? ? ? ? ? Plan - 09/28/21 1113   ? ? Clinical Impression Statement Patient reports that the standing exercises performed in SLS were very difficult. She felt her L knee would give way. HEP is being modified, but ran out of time to complete today due to her arriving a bit late. Need to modify standing exerciess to  side step and backwards step as well as add supine SLR, SLR with ER, and bridge with clamshell.   ? Personal Factors and Comorbidities Comorbidity 3+   ? Comorbidities Bilateral breast cancer and treatment, DM, HTN hyperlipidemia, obesity, knee arthritis, bilateral hand tremors   ? Examination-Activity Limitations Locomotion Level;Transfers;Bed Mobility;Carry;Squat;Stairs;Stand;Lift   ? Examination-Participation Restrictions Church;Meal Prep;Cleaning;Community Activity;Driving   ? Stability/Clinical Decision Making Stable/Uncomplicated   ? Clinical Decision Making Moderate   ? Rehab Potential Good   ? PT Frequency 2x / week   ? PT Duration 2 weeks   ? PT Treatment/Interventions Therapeutic activities;Therapeutic exercise;Patient/family education;ADLs/Self Care Home Management;Iontophoresis 53m/ml Dexamethasone;Gait training;Stair training;Moist Heat;Ultrasound;Electrical Stimulation;Cryotherapy;DME Instruction;Balance training;Neuromuscular re-education;Manual techniques;Energy conservation;Dry needling;Passive range of motion;Taping   ? PT Next Visit Plan Update HEP as outlined under Plan.   ? PT [place]  ? Consulted and Agree with Plan of Care Patient   ? ?  ?  ? ?  ? ? ?Patient will benefit from skilled therapeutic intervention in order to improve the following deficits and impairments:  Increased edema, Decreased strength, Decreased mobility, Decreased range of motion, Abnormal gait, Decreased coordination, Difficulty walking, Improper body mechanics, Impaired flexibility, Decreased balance, Pain ? ?Visit Diagnosis: ?Difficulty in walking, not elsewhere classified ? ?Chronic pain of left knee ? ?Muscle weakness (generalized) ? ?Unsteadiness on feet ? ?Left knee pain, unspecified chronicity ? ? ? ? ?Problem List ?Patient Active Problem List  ? Diagnosis Date Noted  ? Genetic testing 11/13/2020  ? Family history of breast cancer   ? Family history of bladder cancer   ? Personal history of  breast cancer   ? Malignant neoplasm of left breast in female, estrogen receptor negative ([place] 10/23/2020  ? Malignant neoplasm of right breast in female, estrogen receptor positive ([place] 10/07/2020  ? Depression 10/07/2020  ? Diabetes mellitus ([place] 10/07/2020  ? Hypercholesterolemia 10/07/2020  ? Hypertension 10/07/2020  ?  Tremor of both hands 10/07/2020  ? History of gout 09/26/2020  ? ? ?Marcelina Morel, DPT ?09/28/2021, 11:47 AM ? ?[place] ?[place] ?[address] ?[place], [place], [address] ?Phone: [phone]   Fax:  [phone] ? ?Name: Marchetta Navratil ?MRN: [id number] ?Date of Birth: May 29, 1946 ? ? ? ?

## 2021-10-01 ENCOUNTER — Ambulatory Visit: Payer: Medicare HMO | Admitting: Physical Therapy

## 2021-10-01 ENCOUNTER — Encounter: Payer: Self-pay | Admitting: Physical Therapy

## 2021-10-01 DIAGNOSIS — G8929 Other chronic pain: Secondary | ICD-10-CM

## 2021-10-01 DIAGNOSIS — R262 Difficulty in walking, not elsewhere classified: Secondary | ICD-10-CM

## 2021-10-01 DIAGNOSIS — R2681 Unsteadiness on feet: Secondary | ICD-10-CM

## 2021-10-01 DIAGNOSIS — M6281 Muscle weakness (generalized): Secondary | ICD-10-CM

## 2021-10-01 NOTE — Therapy (Signed)
[facility] [address] ?[place], [place], [address] ?Phone: [phone]   Fax:  [phone] ? ?Physical Therapy Treatment ? ?Patient Details  ?Name: Toni Stone ?MRN: [id number] ?Date of Birth: 1946-04-25 ?Referring Provider (PT): Toni Stone ? ? ?Encounter Date: 10/01/2021 ? ? PT End of Session - 10/01/21 1140   ? ? Visit Number 18   ? Date for PT Re-Evaluation 10/13/21   ? PT Start Time 1100   ? PT Stop Time 1145   ? PT Time Calculation (min) 45 min   ? Activity Tolerance Patient tolerated treatment well   ? Behavior During Therapy [facility] for tasks assessed/performed   ? ?  ?  ? ?  ? ? ?Past Medical History:  ?Diagnosis Date  ? Anemia   ? Breast cancer ([place])   ? Chronic kidney disease   ? Diabetes mellitus without complication ([place])   ? Family history of bladder cancer   ? Family history of breast cancer   ? Hypertension   ? Neuromuscular disorder ([place])   ? Tremor  ? Personal history of breast cancer   ? ? ?Past Surgical History:  ?Procedure Laterality Date  ? ABDOMINAL HYSTERECTOMY    ? Age 35s, for ectopic pregnancy  ? BREAST LUMPECTOMY Right 06/23/2004  ? pT1cN0M0 ER/PR + HER2 - stage IA  ? BREAST LUMPECTOMY Left 08/23/2018  ? pT2N0M0 ER/PR/HER2 neg stage IIA  ? ? ?There were no vitals filed for this visit. ? ? Subjective Assessment - 10/01/21 1100   ? ? Subjective Good, very good   ? Currently in Pain? No/denies   ? ?  ?  ? ?  ? ? ? ? ? ? ? ? ? ? ? ? ? ? ? ? ? ? ? ? [facility] Adult PT Treatment/Exercise - 10/01/21 0001   ? ?  ? High Level Balance  ? High Level Balance Activities Side stepping   length of mat table  ? High Level Balance Comments side step over dowel x5 then x3   ?  ? Knee/Hip Exercises: Aerobic  ? Nustep L4 x 6 minutes, U and LE   ?  ? Knee/Hip Exercises: Machines for Strengthening  ? Cybex Knee Extension 5lb 2x10   ? Cybex Knee Flexion 20lb 2x10   ?  ? Knee/Hip Exercises: Seated  ? Marching Both;2 sets;10 reps   ? Sit to Sand 5 reps;3 sets   LE on  airex hands on knees  ? ?  ?  ? ?  ? ? ? ? ? ? ? ? ? ? ? ? PT Short Term Goals - 08/27/21 1039   ? ?  ? PT SHORT TERM GOAL #1  ? Title Patietn will be I iwth basic HEP   ? Time 2   ? Period Weeks   ? Status Achieved   ? Target Date 09/01/21   ? ?  ?  ? ?  ? ? ? ? PT Long Term Goals - 09/28/21 1135   ? ?  ? PT LONG TERM GOAL #1  ? Title I with final HEP   ? Time 2   ? Period Weeks   ? Status On-going   ? Target Date 10/13/21   ?  ? PT LONG TERM GOAL #2  ? Title Increase BLE strength to at least 4/5   ? Time 2   ? Period Weeks   ? Status On-going   ? Target Date 10/13/21   ?  ?  PT LONG TERM GOAL #3  ? Title Perform 5 x STS in < 20 sec to demosntrate improved strength and balance.   ? Status Achieved   ?  ? PT LONG TERM GOAL #4  ? Title Patient will ambulate at least 200' with LRAD with no C/O pain, demosntrating stable, efficient gait.   ? Time 2   ? Period Weeks   ? Status On-going   ? Target Date 10/13/21   ? ?  ?  ? ?  ? ? ? ? ? ? ? ? Plan - 10/01/21 1141   ? ? Clinical Impression Statement Heavy focus spent on balance but some LE strengthening. Some instability with intervention tat uses airex pad. Increase fatigue noted with standing marches. No LOB with side step but performed slowly. CGA as well as verbal cues needed to increase sp length with side steps over dowel rod.   ? Personal Factors and Comorbidities Comorbidity 3+   ? Comorbidities Bilateral breast cancer and treatment, DM, HTN hyperlipidemia, obesity, knee arthritis, bilateral hand tremors   ? Examination-Activity Limitations Locomotion Level;Transfers;Bed Mobility;Carry;Squat;Stairs;Stand;Lift   ? Examination-Participation Restrictions Church;Meal Prep;Cleaning;Community Activity;Driving   ? Stability/Clinical Decision Making Stable/Uncomplicated   ? Rehab Potential Good   ? PT Frequency 2x / week   ? PT Duration 2 weeks   ? PT Treatment/Interventions Therapeutic activities;Therapeutic exercise;Patient/family education;ADLs/Self Care Home  Management;Iontophoresis 28m/ml Dexamethasone;Gait training;Stair training;Moist Heat;Ultrasound;Electrical Stimulation;Cryotherapy;DME Instruction;Balance training;Neuromuscular re-education;Manual techniques;Energy conservation;Dry needling;Passive range of motion;Taping   ? PT Next Visit Plan balance and LE strength   ? ?  ?  ? ?  ? ? ?Patient will benefit from skilled therapeutic intervention in order to improve the following deficits and impairments:  Increased edema, Decreased strength, Decreased mobility, Decreased range of motion, Abnormal gait, Decreased coordination, Difficulty walking, Improper body mechanics, Impaired flexibility, Decreased balance, Pain ? ?Visit Diagnosis: ?Difficulty in walking, not elsewhere classified ? ?Chronic pain of left knee ? ?Muscle weakness (generalized) ? ?Unsteadiness on feet ? ? ? ? ?Problem List ?Patient Active Problem List  ? Diagnosis Date Noted  ? Genetic testing 11/13/2020  ? Family history of breast cancer   ? Family history of bladder cancer   ? Personal history of breast cancer   ? Malignant neoplasm of left breast in female, estrogen receptor negative ([place] 10/23/2020  ? Malignant neoplasm of right breast in female, estrogen receptor positive ([place] 10/07/2020  ? Depression 10/07/2020  ? Diabetes mellitus ([place] 10/07/2020  ? Hypercholesterolemia 10/07/2020  ? Hypertension 10/07/2020  ? Tremor of both hands 10/07/2020  ? History of gout 09/26/2020  ? ? ?[name] ?10/01/2021, 11:44 AM ? ?[place] ?[name] ?[name] [address]?Phone: [phone]  Fax:  [phone]? ?Name: FCaisley Stone?MRN: [id number]?Date of Birth: 8Oct 23, 1947? ? ? ?

## 2021-10-05 ENCOUNTER — Ambulatory Visit: Payer: Medicare HMO | Admitting: Physical Therapy

## 2021-10-05 ENCOUNTER — Encounter: Payer: Self-pay | Admitting: Physical Therapy

## 2021-10-05 DIAGNOSIS — M6281 Muscle weakness (generalized): Secondary | ICD-10-CM

## 2021-10-05 DIAGNOSIS — G8929 Other chronic pain: Secondary | ICD-10-CM

## 2021-10-05 DIAGNOSIS — R2681 Unsteadiness on feet: Secondary | ICD-10-CM

## 2021-10-05 DIAGNOSIS — R262 Difficulty in walking, not elsewhere classified: Secondary | ICD-10-CM

## 2021-10-05 DIAGNOSIS — M25612 Stiffness of left shoulder, not elsewhere classified: Secondary | ICD-10-CM

## 2021-10-05 DIAGNOSIS — M25562 Pain in left knee: Secondary | ICD-10-CM

## 2021-10-05 DIAGNOSIS — M25611 Stiffness of right shoulder, not elsewhere classified: Secondary | ICD-10-CM

## 2021-10-05 NOTE — Therapy (Signed)
[facility] ?[place] ?[address] ?[place], [place], [address] ?Phone: [phone]   Fax:  [phone] ? ?Physical Therapy Treatment ? ?Patient Details  ?Name: Toni Stone ?MRN: [id number] ?Date of Birth: 1945/09/01 ?Referring Provider (PT): Eldridge Abrahams ? ? ?Encounter Date: 10/05/2021 ? ? PT End of Session - 10/05/21 1056   ? ? Visit Number 19   ? Number of Visits 20   ? Date for PT Re-Evaluation 10/13/21   ? PT Start Time 1024   Patient was 9 mins late  ? PT Stop Time 1059   ? PT Time Calculation (min) 35 min   ? Activity Tolerance Patient tolerated treatment well   ? Behavior During Therapy [facility] for tasks assessed/performed   ? ?  ?  ? ?  ? ? ?Past Medical History:  ?Diagnosis Date  ? Anemia   ? Breast cancer ([place])   ? Chronic kidney disease   ? Diabetes mellitus without complication ([place])   ? Family history of bladder cancer   ? Family history of breast cancer   ? Hypertension   ? Neuromuscular disorder ([place])   ? Tremor  ? Personal history of breast cancer   ? ? ?Past Surgical History:  ?Procedure Laterality Date  ? ABDOMINAL HYSTERECTOMY    ? Age 41s, for ectopic pregnancy  ? BREAST LUMPECTOMY Right 06/23/2004  ? pT1cN0M0 ER/PR + HER2 - stage IA  ? BREAST LUMPECTOMY Left 08/23/2018  ? pT2N0M0 ER/PR/HER2 neg stage IIA  ? ? ?There were no vitals filed for this visit. ? ? Subjective Assessment - 10/05/21 1024   ? ? Subjective Feeling good   ? Currently in Pain? No/denies   ? ?  ?  ? ?  ? ? ? ? ? ? ? ? ? ? ? ? ? ? ? ? ? ? ? ? [place] Adult PT Treatment/Exercise - 10/05/21 0001   ? ?  ? Ambulation/Gait  ? Gait Comments 2 laps inside building w/ SPC; fwd/backward walking for about 15 ft x3, no AD   no R reciprocal arm swing w/ lap. CGA w/ backwards walking. VC's needed to walk straight backwards.  ?  ? Knee/Hip Exercises: Standing  ? Forward Step Up Both;2 sets;5 reps;Hand Hold: 1;Step Height: 4"   CGA w/ right foot step ups.  ? Other Standing Knee Exercises B side stepping over  cane x 10 CGA; lateral side steps, length of mat table, no AD x3 both directions.   ? Other Standing Knee Exercises Alternating step taps 6' 2x10 w/ SPC CGA w/ first 5 reps;alternating step taps 6' on airex w/ SPC 1 set of 10, CGA, LOB x1.   ?  ? Knee/Hip Exercises: Seated  ? Sit to Sand with UE support;5 reps;3 sets   UE on knees  ? ?  ?  ? ?  ? ? ? ? ? ? ? ? ? ? ? ? PT Short Term Goals - 08/27/21 1039   ? ?  ? PT SHORT TERM GOAL #1  ? Title Patietn will be I iwth basic HEP   ? Time 2   ? Period Weeks   ? Status Achieved   ? Target Date 09/01/21   ? ?  ?  ? ?  ? ? ? ? PT Long Term Goals - 09/28/21 1135   ? ?  ? PT LONG TERM GOAL #1  ? Title I with final HEP   ? Time 2   ? Period  Weeks   ? Status On-going   ? Target Date 10/13/21   ?  ? PT LONG TERM GOAL #2  ? Title Increase BLE strength to at least 4/5   ? Time 2   ? Period Weeks   ? Status On-going   ? Target Date 10/13/21   ?  ? PT LONG TERM GOAL #3  ? Title Perform 5 x STS in < 20 sec to demosntrate improved strength and balance.   ? Status Achieved   ?  ? PT LONG TERM GOAL #4  ? Title Patient will ambulate at least 200' with LRAD with no C/O pain, demosntrating stable, efficient gait.   ? Time 2   ? Period Weeks   ? Status On-going   ? Target Date 10/13/21   ? ?  ?  ? ?  ? ? ? ? ? ? ? ? Plan - 10/05/21 1059   ? ? Clinical Impression Statement Pt enters clinic feeling fine, Patient had no reciprocal arm swing on the R UE during initial gait warm up. Pt did well with al box taps form floor, airex did cause some mild instability. VC's needed to focus on going straight with backwards walking, pt tended to veer to her L. RLE weakness in the RLE present with 4 inch step upsl   ? Personal Factors and Comorbidities Comorbidity 3+   ? Comorbidities Bilateral breast cancer and treatment, DM, HTN hyperlipidemia, obesity, knee arthritis, bilateral hand tremors   ? Examination-Activity Limitations Locomotion Level;Transfers;Bed Mobility;Carry;Squat;Stairs;Stand;Lift   ?  Examination-Participation Restrictions Church;Meal Prep;Cleaning;Community Activity;Driving   ? Stability/Clinical Decision Making Stable/Uncomplicated   ? Clinical Decision Making Moderate   ? Rehab Potential Good   ? PT Frequency 2x / week   ? PT Duration 2 weeks   ? PT Treatment/Interventions Therapeutic activities;Therapeutic exercise;Patient/family education;ADLs/Self Care Home Management;Iontophoresis 70m/ml Dexamethasone;Gait training;Stair training;Moist Heat;Ultrasound;Electrical Stimulation;Cryotherapy;DME Instruction;Balance training;Neuromuscular re-education;Manual techniques;Energy conservation;Dry needling;Passive range of motion;Taping   ? PT Next Visit Plan balance and LE strength   ? PT [place]  ? Consulted and Agree with Plan of Care Patient   ? ?  ?  ? ?  ? ? ?Patient will benefit from skilled therapeutic intervention in order to improve the following deficits and impairments:  Increased edema, Decreased strength, Decreased mobility, Decreased range of motion, Abnormal gait, Decreased coordination, Difficulty walking, Improper body mechanics, Impaired flexibility, Decreased balance, Pain ? ?Visit Diagnosis: ?Muscle weakness (generalized) ? ?Chronic pain of left knee ? ?Difficulty in walking, not elsewhere classified ? ?Unsteadiness on feet ? ?Left knee pain, unspecified chronicity ? ?Stiffness of left shoulder, not elsewhere classified ? ?Stiffness of right shoulder, not elsewhere classified ? ? ? ? ?Problem List ?Patient Active Problem List  ? Diagnosis Date Noted  ? Genetic testing 11/13/2020  ? Family history of breast cancer   ? Family history of bladder cancer   ? Personal history of breast cancer   ? Malignant neoplasm of left breast in female, estrogen receptor negative ([place] 10/23/2020  ? Malignant neoplasm of right breast in female, estrogen receptor positive ([place] 10/07/2020  ? Depression 10/07/2020  ? Diabetes mellitus ([place] 10/07/2020  ? Hypercholesterolemia  10/07/2020  ? Hypertension 10/07/2020  ? Tremor of both hands 10/07/2020  ? History of gout 09/26/2020  ? ? ?[name] ?10/05/2021, 11:07 AM ? ?[facility] ?[address] ?[place] [address]?Phone: [phone]  Fax:  [phone]? ?Name:  Toni Stone ?MRN: [id number] ?Date of Birth: April 06, 1946 ? ? ? ?

## 2021-10-09 ENCOUNTER — Ambulatory Visit: Payer: Medicare HMO | Admitting: Physical Therapy

## 2021-10-09 DIAGNOSIS — M6281 Muscle weakness (generalized): Secondary | ICD-10-CM

## 2021-10-09 DIAGNOSIS — R262 Difficulty in walking, not elsewhere classified: Secondary | ICD-10-CM

## 2021-10-09 DIAGNOSIS — R2681 Unsteadiness on feet: Secondary | ICD-10-CM

## 2021-10-09 DIAGNOSIS — G8929 Other chronic pain: Secondary | ICD-10-CM

## 2021-10-09 NOTE — Therapy (Signed)
[facility] ?[place] [address] ?[place], [place], [address] ?Phone: [phone]   Fax:  [phone] ? ?Physical Therapy Treatment ? ?Patient Details  ?Name: Toni Stone ?MRN: [id number] ?Date of Birth: 1946/01/07 ?Referring Provider (PT): Eldridge Abrahams ? ? ?Encounter Date: 10/09/2021 ? ? PT End of Session - 10/09/21 1142   ? ? Visit Number 20   ? Date for PT Re-Evaluation 10/13/21   ? PT Start Time 1100   ? PT Stop Time 1145   ? PT Time Calculation (min) 45 min   ? ?  ?  ? ?  ? ? ?Past Medical History:  ?Diagnosis Date  ? Anemia   ? Breast cancer ([place])   ? Chronic kidney disease   ? Diabetes mellitus without complication ([place])   ? Family history of bladder cancer   ? Family history of breast cancer   ? Hypertension   ? Neuromuscular disorder ([place])   ? Tremor  ? Personal history of breast cancer   ? ? ?Past Surgical History:  ?Procedure Laterality Date  ? ABDOMINAL HYSTERECTOMY    ? Age 69s, for ectopic pregnancy  ? BREAST LUMPECTOMY Right 06/23/2004  ? pT1cN0M0 ER/PR + HER2 - stage IA  ? BREAST LUMPECTOMY Left 08/23/2018  ? pT2N0M0 ER/PR/HER2 neg stage IIA  ? ? ?There were no vitals filed for this visit. ? ? Subjective Assessment - 10/09/21 1104   ? ? Subjective hand and knee xrays yesterdays- no results yet.MD recommend 4 WW   ? Currently in Pain? Yes   ? Pain Score 4    ? Pain Location Knee   ? Pain Orientation Right   ? ?  ?  ? ?  ? ? ? ? ? ? ? ? ? ? ? ? ? ? ? ? ? ? ? ? [place] Adult PT Treatment/Exercise - 10/09/21 0001   ? ?  ? Knee/Hip Exercises: Aerobic  ? Nustep L4 x 6 minutes, U and LE   ?  ? Knee/Hip Exercises: Machines for Strengthening  ? Cybex Knee Extension 5lb 2x10   ? Cybex Knee Flexion 20lb 2x10   ?  ? Knee/Hip Exercises: Standing  ? Walking with Sports Cord 20# fwd 3 x and laterally 2 x   ?  ? Knee/Hip Exercises: Seated  ? Long [employer] Both   ? Ball Squeeze 15x   ? Clamshell with TheraBand Red   2 sets 10  ? Marching Strengthening;Both;2 sets;10 reps   red  tband  ? Sit to Sand 10 reps   from mat hands on knees  ? ?  ?  ? ?  ? ? ? ? ? ? ? ? ? ? ? ? PT Short Term Goals - 08/27/21 1039   ? ?  ? PT SHORT TERM GOAL #1  ? Title Patietn will be I iwth basic HEP   ? Time 2   ? Period Weeks   ? Status Achieved   ? Target Date 09/01/21   ? ?  ?  ? ?  ? ? ? ? PT Long Term Goals - 10/09/21 1105   ? ?  ? PT LONG TERM GOAL #1  ? Title I with final HEP   ? Status Partially Met   ?  ? PT LONG TERM GOAL #2  ? Title Increase BLE strength to at least 4/5   ? Baseline left knee and hip 4/5, RT knee ext 4-/5 and hip flex 4-/5 other 4 /  5   ? Status Partially Met   ?  ? PT LONG TERM GOAL #3  ? Title Perform 5 x STS in < 20 sec to demosntrate improved strength and balance.   ? Status Achieved   ?  ? PT LONG TERM GOAL #4  ? Title Patient will ambulate at least 200' with LRAD with no C/O pain, demosntrating stable, efficient gait.   ? Status On-going   ? ?  ?  ? ?  ? ? ? ? ? ? ? ? Plan - 10/09/21 1112   ? ? Clinical Impression Statement pt feels therapy is really helping, fels stronger and walking better, however it has been advised by PCP to get 4WW and explained to pt it is safer and will increase speed on gait. strength and other LTGS are improving. pt had xrays yesterday but no resuts yet however replacement has been suggested and " I really dont want that"   ? PT Treatment/Interventions Therapeutic activities;Therapeutic exercise;Patient/family education;ADLs/Self Care Home Management;Iontophoresis 64m/ml Dexamethasone;Gait training;Stair training;Moist Heat;Ultrasound;Electrical Stimulation;Cryotherapy;DME Instruction;Balance training;Neuromuscular re-education;Manual techniques;Energy conservation;Dry needling;Passive range of motion;Taping   ? PT Next Visit Plan balance and LE strength   ? ?  ?  ? ?  ? ? ?Patient will benefit from skilled therapeutic intervention in order to improve the following deficits and impairments:  Increased edema, Decreased strength, Decreased mobility,  Decreased range of motion, Abnormal gait, Decreased coordination, Difficulty walking, Improper body mechanics, Impaired flexibility, Decreased balance, Pain ? ?Visit Diagnosis: ?Muscle weakness (generalized) ? ?Chronic pain of left knee ? ?Difficulty in walking, not elsewhere classified ? ?Unsteadiness on feet ? ? ? ? ?Problem List ?Patient Active Problem List  ? Diagnosis Date Noted  ? Genetic testing 11/13/2020  ? Family history of breast cancer   ? Family history of bladder cancer   ? Personal history of breast cancer   ? Malignant neoplasm of left breast in female, estrogen receptor negative ([place] 10/23/2020  ? Malignant neoplasm of right breast in female, estrogen receptor positive ([place] 10/07/2020  ? Depression 10/07/2020  ? Diabetes mellitus ([place] 10/07/2020  ? Hypercholesterolemia 10/07/2020  ? Hypertension 10/07/2020  ? Tremor of both hands 10/07/2020  ? History of gout 09/26/2020  ? ? ?Toni Stone,ANGIE, PTA ?10/09/2021, 11:42 AM ? ?[place] ?[address] ?[place] [address]?Phone: [phone]  Fax:  [phone]? ?Name: Toni Stone?MRN: [id number]?Date of Birth: [birth year]? ? ? ?

## 2021-10-12 ENCOUNTER — Telehealth: Payer: Self-pay

## 2021-10-12 NOTE — Telephone Encounter (Signed)
Spoke with pt to tell her that her f/u appt is scheduled with Dr. Burr Medico on 11/04/2021.  Pt is not d/t for mammogram at this time based on Dr. Ernestina Penna last office note from 04/2021.  Pt verbalized understanding and had no further questions at this time. ?

## 2021-10-20 ENCOUNTER — Encounter: Payer: Self-pay | Admitting: Podiatry

## 2021-10-20 ENCOUNTER — Ambulatory Visit: Payer: Medicare HMO | Admitting: Podiatry

## 2021-10-20 DIAGNOSIS — M79675 Pain in left toe(s): Secondary | ICD-10-CM | POA: Diagnosis not present

## 2021-10-20 DIAGNOSIS — B351 Tinea unguium: Secondary | ICD-10-CM

## 2021-10-20 DIAGNOSIS — M79674 Pain in right toe(s): Secondary | ICD-10-CM

## 2021-10-20 DIAGNOSIS — E119 Type 2 diabetes mellitus without complications: Secondary | ICD-10-CM

## 2021-10-20 NOTE — Progress Notes (Signed)
?  Subjective:  ?Patient ID: Toni Stone, female    DOB: 08-13-1945,  MRN: [id number] ? ?Chief Complaint  ?Patient presents with  ? Debridement  ?  Trim toenails - diabetic - a1c was 6.1  ? ? ?77 y.o. female returns with the above complaint. History confirmed with patient.  Blood sugars under good control, she is doing well no new issues otherwise ? ?Objective:  ?Physical Exam: ?warm, good capillary refill, no trophic changes or ulcerative lesions, normal DP and PT pulses, normal monofilament exam and normal sensory exam. Onychomycosis x10 ? ?Assessment:  ? ?1. Pain due to onychomycosis of toenails of both feet   ?2. Type 2 diabetes mellitus without complication, without long-term current use of insulin ([place])   ? ? ? ? ? ?Plan:  ?Patient was evaluated and treated and all questions answered. ? ?Discussed the etiology and treatment options for the condition in detail with the patient. Educated patient on the topical and oral treatment options for mycotic nails. Recommended debridement of the nails today. Sharp and mechanical debridement performed of all painful and mycotic nails today. Nails debrided in length and thickness using a nail nipper to level of comfort. Discussed treatment options including appropriate shoe gear. Follow up as needed for painful nails. ? ?Patient educated on diabetes. Discussed proper diabetic foot care and discussed risks and complications of disease. Educated patient in depth on reasons to return to the office immediately should he/she discover anything concerning or new on the feet. All questions answered. Discussed proper shoes as well.  ? ? ?No follow-ups on file.  ? ?

## 2021-11-03 ENCOUNTER — Other Ambulatory Visit: Payer: Self-pay

## 2021-11-03 DIAGNOSIS — C50912 Malignant neoplasm of unspecified site of left female breast: Secondary | ICD-10-CM

## 2021-11-03 DIAGNOSIS — C50911 Malignant neoplasm of unspecified site of right female breast: Secondary | ICD-10-CM

## 2021-11-04 ENCOUNTER — Inpatient Hospital Stay: Payer: Medicare HMO | Admitting: Hematology

## 2021-11-04 ENCOUNTER — Encounter: Payer: Self-pay | Admitting: Hematology

## 2021-11-04 ENCOUNTER — Inpatient Hospital Stay: Payer: Medicare HMO | Attending: Hematology

## 2021-11-04 ENCOUNTER — Other Ambulatory Visit: Payer: Self-pay

## 2021-11-04 VITALS — BP 110/85 | HR 69 | Temp 98.2°F | Resp 19 | Wt 241.7 lb

## 2021-11-04 DIAGNOSIS — E1122 Type 2 diabetes mellitus with diabetic chronic kidney disease: Secondary | ICD-10-CM | POA: Insufficient documentation

## 2021-11-04 DIAGNOSIS — D649 Anemia, unspecified: Secondary | ICD-10-CM | POA: Diagnosis not present

## 2021-11-04 DIAGNOSIS — Z171 Estrogen receptor negative status [ER-]: Secondary | ICD-10-CM | POA: Diagnosis not present

## 2021-11-04 DIAGNOSIS — C50912 Malignant neoplasm of unspecified site of left female breast: Secondary | ICD-10-CM

## 2021-11-04 DIAGNOSIS — G8929 Other chronic pain: Secondary | ICD-10-CM | POA: Insufficient documentation

## 2021-11-04 DIAGNOSIS — G250 Essential tremor: Secondary | ICD-10-CM | POA: Diagnosis not present

## 2021-11-04 DIAGNOSIS — C50911 Malignant neoplasm of unspecified site of right female breast: Secondary | ICD-10-CM

## 2021-11-04 DIAGNOSIS — N189 Chronic kidney disease, unspecified: Secondary | ICD-10-CM | POA: Diagnosis not present

## 2021-11-04 DIAGNOSIS — I129 Hypertensive chronic kidney disease with stage 1 through stage 4 chronic kidney disease, or unspecified chronic kidney disease: Secondary | ICD-10-CM | POA: Diagnosis not present

## 2021-11-04 DIAGNOSIS — Z8052 Family history of malignant neoplasm of bladder: Secondary | ICD-10-CM | POA: Insufficient documentation

## 2021-11-04 DIAGNOSIS — Z803 Family history of malignant neoplasm of breast: Secondary | ICD-10-CM | POA: Diagnosis not present

## 2021-11-04 DIAGNOSIS — M25561 Pain in right knee: Secondary | ICD-10-CM | POA: Insufficient documentation

## 2021-11-04 DIAGNOSIS — M8580 Other specified disorders of bone density and structure, unspecified site: Secondary | ICD-10-CM | POA: Diagnosis not present

## 2021-11-04 DIAGNOSIS — Z853 Personal history of malignant neoplasm of breast: Secondary | ICD-10-CM | POA: Insufficient documentation

## 2021-11-04 DIAGNOSIS — Z170 Estrogen receptor positive status [ER+]: Secondary | ICD-10-CM

## 2021-11-04 LAB — CBC WITH DIFFERENTIAL (CANCER CENTER ONLY)
Abs Immature Granulocytes: 0 10*3/uL (ref 0.00–0.07)
Basophils Absolute: 0 10*3/uL (ref 0.0–0.1)
Basophils Relative: 1 %
Eosinophils Absolute: 0.2 10*3/uL (ref 0.0–0.5)
Eosinophils Relative: 4 %
HCT: 33.7 % — ABNORMAL LOW (ref 36.0–46.0)
Hemoglobin: 11.1 g/dL — ABNORMAL LOW (ref 12.0–15.0)
Immature Granulocytes: 0 %
Lymphocytes Relative: 23 %
Lymphs Abs: 1 10*3/uL (ref 0.7–4.0)
MCH: 30.7 pg (ref 26.0–34.0)
MCHC: 32.9 g/dL (ref 30.0–36.0)
MCV: 93.4 fL (ref 80.0–100.0)
Monocytes Absolute: 0.4 10*3/uL (ref 0.1–1.0)
Monocytes Relative: 8 %
Neutro Abs: 2.7 10*3/uL (ref 1.7–7.7)
Neutrophils Relative %: 64 %
Platelet Count: 174 10*3/uL (ref 150–400)
RBC: 3.61 MIL/uL — ABNORMAL LOW (ref 3.87–5.11)
RDW: 13.9 % (ref 11.5–15.5)
WBC Count: 4.2 10*3/uL (ref 4.0–10.5)
nRBC: 0 % (ref 0.0–0.2)

## 2021-11-04 LAB — CMP (CANCER CENTER ONLY)
ALT: 21 U/L (ref 0–44)
AST: 23 U/L (ref 15–41)
Albumin: 3.6 g/dL (ref 3.5–5.0)
Alkaline Phosphatase: 112 U/L (ref 38–126)
Anion gap: 3 — ABNORMAL LOW (ref 5–15)
BUN: 35 mg/dL — ABNORMAL HIGH (ref 8–23)
CO2: 33 mmol/L — ABNORMAL HIGH (ref 22–32)
Calcium: 9.3 mg/dL (ref 8.9–10.3)
Chloride: 107 mmol/L (ref 98–111)
Creatinine: 1.29 mg/dL — ABNORMAL HIGH (ref 0.44–1.00)
GFR, Estimated: 44 mL/min — ABNORMAL LOW (ref >60)
Glucose, Bld: 97 mg/dL (ref 70–99)
Potassium: 5.1 mmol/L (ref 3.5–5.1)
Sodium: 143 mmol/L (ref 135–145)
Total Bilirubin: 0.2 mg/dL — ABNORMAL LOW (ref 0.3–1.2)
Total Protein: 7.4 g/dL (ref 6.5–8.1)

## 2021-11-04 NOTE — Progress Notes (Signed)
?[facility]   ?Telephone:([phone] [phone]   ?Clinic Follow up Note  ? ?Patient Care Team: ?Berkley Harvey, NP as PCP - General (Nurse Practitioner) ? ?Date of Service:  11/04/2021 ? ?CHIEF COMPLAINT: f/u of left breast cancer ? ?CURRENT THERAPY:  ?Surveillance ? ?ASSESSMENT & PLAN:  ?Toni Stone is a 76 y.o. post-menopausal female with  ? ?1.  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  Staging PET scan was negative for distant metastasis.  She completed adjuvant chemo (possibly AC-T) and radiation. Due to triple negative breast cancer, she would not benefit from antiestrogen therapy ?-CA 27-29 normal, last 05/06/21, today's result is pending ?-most recent MM on 04/20/21 showed a suspicious left breast lesion. Biopsy 05/04/21 was benign. She is due for short-term f/u; I ordered today. ?-she appears stable. Labs reviewed, overall stable to improved. Physical exam was unremarkable. There is no clinical concern for recurrence. ?  ?2. Osteopenia ?-DEXA 05/05/21 showed osteopenia, borderline osteoporosis (T-score -2.4). ?-I previously recommended she take calcium and vit D. ?  ?3.  Malignant neoplasm right breast, stage 1A, pT1cN0M0, ER/PR positive, HER2 negative, grade 2 ?-Diagnosed 06/23/04, s/p lumpectomy with axillary lymph node dissection, adjuvant chemo (per patient), radiation, and 5 years of antiestrogen therapy with anastrozole ?-Continue surveillance ?  ?4. Genetics ?-Given patient's history of bilateral breast cancer, and family history of sister with breast and bladder cancer, and mother with cancer, she qualifies for genetics ?-She underwent genetic testing on 10/27/20, results were negative ?  ?5. Chronic right knee pain ?-Ambulates with a cane, would like to get a wheelchair. I advised her to talk to her  PCP. ?-Denies any other new/worsening bone or joint pain ?-she recently completed PT ?  ?6. Essential tremors, DM, CKD, anemia, HTN, HL ?-Followed by PCP ?-HG A1c 7.8, BG 172, SCR 1.49, K5.6 on 09/26/20 ?-iron panel 10/27/20 was WNL      ?-previously seen by neurology due to tremors. She denies Parkinson's diagnosis. ?  ?7. Health maintenance, disease prevention ?-Colonoscopy in 2014 and EGD in 2015 in [place] to be her last, per patient ?-Continue follow-up with PCP ?  ?  ?PLAN: ?-6 months short-term f/u left MM and [place] to be done soon ?-lab and f/u in 6 months, per pt request ? ? ?No problem-specific Assessment & Plan notes found for this encounter. ? ? ?SUMMARY OF ONCOLOGIC HISTORY: ?Oncology History  ?Malignant neoplasm of right breast in female, estrogen receptor positive ([place])  ?06/23/2004 Cancer Staging  ? Staging form: Breast, AJCC 8th Edition ?- Pathologic stage from 06/23/2004: Stage IA (pT1c, pN0, cM0, G2, ER+, PR+, HER2-) - Signed by Alla Feeling, NP on 10/23/2020 ?Stage prefix: Initial diagnosis ?Histologic grading system: 3 grade system ? ?  ?10/07/2020 Initial Diagnosis  ? Malignant neoplasm of right breast in female, estrogen receptor positive ([place]) ? ?  ? Genetic Testing  ? Negative genetic testing. No pathogenic variants identified on the [facility] Multi-Cancer Panel+RNA. The report date is 11/12/2020. ? ?The Multi-Cancer Panel + RNA offered by [facility] includes sequencing and/or deletion duplication testing of the following 84 genes: AIP, ALK, APC, ATM, AXIN2,BAP1,  BARD1, BLM, BMPR1A, BRCA1, BRCA2, BRIP1, CASR, CDC73, CDH1, CDK4, CDKN1B, CDKN1C, CDKN2A (p14ARF), CDKN2A (p16INK4a), CEBPA, CHEK2, CTNNA1, DICER1, DIS3L2, EGFR (c.2369C>T, p.Thr790Met variant only), EPCAM (Deletion/duplication testing only), FH, FLCN, GATA2, GPC3, GREM1 (  Promoter region deletion/duplication testing only), HOXB13 (c.251G>A, p.Gly84Glu), HRAS, KIT, MAX, MEN1, MET, MITF (c.952G>A, p.Glu318Lys variant only), MLH1, MSH2, MSH3, MSH6, MUTYH,  NBN, NF1, NF2, NTHL1, PALB2, PDGFRA, PHOX2B, PMS2, POLD1, POLE, POT1, PRKAR1A, PTCH1, PTEN, RAD50, RAD51C, RAD51D, RB1, RECQL4, RET, RUNX1, SDHAF2, SDHA (sequence changes only), SDHB, SDHC, SDHD, SMAD4, SMARCA4, SMARCB1, SMARCE1, STK11, SUFU, TERC, TERT, TMEM127, TP53, TSC1, TSC2, VHL, WRN and WT1. ?  ?Malignant neoplasm of left breast in female, estrogen receptor negative ([place])  ?08/23/2018 Cancer Staging  ? Staging form: Breast, AJCC 8th Edition ?- Pathologic stage from 08/23/2018: Stage IIA (pT2, pN0, cM0, G3, ER-, PR-, HER2-) - Signed by Alla Feeling, NP on 10/23/2020 ?Stage prefix: Initial diagnosis ?Histologic grading system: 3 grade system ? ?  ?10/23/2020 Initial Diagnosis  ? Malignant neoplasm of left breast in female, estrogen receptor negative ([place]) ? ?  ? Genetic Testing  ? Negative genetic testing. No pathogenic variants identified on the [facility] Multi-Cancer Panel+RNA. The report date is 11/12/2020. ? ?The Multi-Cancer Panel + RNA offered by [facility] includes sequencing and/or deletion duplication testing of the following 84 genes: AIP, ALK, APC, ATM, AXIN2,BAP1,  BARD1, BLM, BMPR1A, BRCA1, BRCA2, BRIP1, CASR, CDC73, CDH1, CDK4, CDKN1B, CDKN1C, CDKN2A (p14ARF), CDKN2A (p16INK4a), CEBPA, CHEK2, CTNNA1, DICER1, DIS3L2, EGFR (c.2369C>T, p.Thr790Met variant only), EPCAM (Deletion/duplication testing only), FH, FLCN, GATA2, GPC3, GREM1 (Promoter region deletion/duplication testing only), HOXB13 (c.251G>A, p.Gly84Glu), HRAS, KIT, MAX, MEN1, MET, MITF (c.952G>A, p.Glu318Lys variant only), MLH1, MSH2, MSH3, MSH6, MUTYH, NBN, NF1, NF2, NTHL1, PALB2, PDGFRA, PHOX2B, PMS2, POLD1, POLE, POT1, PRKAR1A, PTCH1, PTEN, RAD50, RAD51C, RAD51D, RB1, RECQL4, RET, RUNX1, SDHAF2, SDHA (sequence changes only), SDHB, SDHC, SDHD, SMAD4, SMARCA4, SMARCB1, SMARCE1, STK11, SUFU, TERC, TERT, TMEM127, TP53, TSC1, TSC2, VHL, WRN and WT1. ?  ? ? ? ?INTERVAL HISTORY:  ?Toni Stone is here for a follow up of breast cancer. She was last seen  by me on 05/06/21. She presents to the clinic alone. ?She reports she just started PT yesterday for lymphedema on her right hand. She notes she completed PT for arthritis in her knee. Otherwise, she is doing well overall. ?  ?All other systems were reviewed with the patient and are negative. ? ?MEDICAL HISTORY:  ?Past Medical History:  ?Diagnosis Date  ? Anemia   ? Breast cancer ([place])   ? Chronic kidney disease   ? Diabetes mellitus without complication ([place])   ? Family history of bladder cancer   ? Family history of breast cancer   ? Hypertension   ? Neuromuscular disorder ([place])   ? Tremor  ? Personal history of breast cancer   ? ? ?SURGICAL HISTORY: ?Past Surgical History:  ?Procedure Laterality Date  ? ABDOMINAL HYSTERECTOMY    ? Age 21s, for ectopic pregnancy  ? BREAST LUMPECTOMY Right 06/23/2004  ? pT1cN0M0 ER/PR + HER2 - stage IA  ? BREAST LUMPECTOMY Left 08/23/2018  ? pT2N0M0 ER/PR/HER2 neg stage IIA  ? ? ?I have reviewed the social history and family history with the patient and they are unchanged from previous note. ? ?ALLERGIES:  is allergic to metformin. ? ?MEDICATIONS:  ?Current Outpatient Medications  ?Medication Sig Dispense Refill  ? Blood Glucose Monitoring Suppl (GLUCOCOM BLOOD GLUCOSE MONITOR) DEVI 1 each by Misc.(Non-Drug; Combo Route) route daily. May substitute for insurance    ? febuxostat (ULORIC) 40 MG tablet Take 1 tablet by mouth daily.    ? glucose blood ([employer] BLOOD GLUCOSE TEST) test strip 1 each by Other route daily.    ?  insulin glargine (LANTUS) 100 UNIT/ML injection Inject into the skin.    ? pregabalin (LYRICA) 75 MG capsule Take by mouth.    ? primidone (MYSOLINE) 250 MG tablet Take by mouth.    ? propranolol ER (INDERAL LA) 120 MG 24 hr capsule Take 120 mg by mouth daily.    ? rosuvastatin (CRESTOR) 10 MG tablet Take 10 mg by mouth at bedtime.    ? sitaGLIPtin (JANUVIA) 50 MG tablet Take 1 tablet by mouth daily.    ? triamterene-hydrochlorothiazide (MAXZIDE-25) 37.5-25 MG tablet  Take 1 tablet by mouth daily.    ? ?No current facility-administered medications for this visit.  ? ? ?PHYSICAL EXAMINATION: ?ECOG PERFORMANCE STATUS: 0 - Asymptomatic ? ?Vitals:  ? 11/04/21 1203  ?BP: 110

## 2021-11-05 LAB — CANCER ANTIGEN 27.29: CA 27.29: 24.6 U/mL (ref 0.0–38.6)

## 2021-11-06 ENCOUNTER — Telehealth: Payer: Self-pay | Admitting: Hematology

## 2021-11-06 NOTE — Telephone Encounter (Signed)
Scheduled follow-up appointment per 5/17 los. Patient is aware.

## 2021-11-26 ENCOUNTER — Ambulatory Visit
Admission: RE | Admit: 2021-11-26 | Discharge: 2021-11-26 | Disposition: A | Discharge status: Home / Self Care | Payer: Medicare HMO | Source: Ambulatory Visit | Attending: Hematology | Admitting: Hematology

## 2021-11-26 DIAGNOSIS — C50911 Malignant neoplasm of unspecified site of right female breast: Secondary | ICD-10-CM

## 2021-11-26 HISTORY — DX: Personal history of irradiation: Z92.3

## 2021-11-26 HISTORY — DX: Personal history of antineoplastic chemotherapy: Z92.21

## 2021-12-03 ENCOUNTER — Other Ambulatory Visit: Payer: Medicare HMO

## 2022-01-20 ENCOUNTER — Encounter: Payer: Self-pay | Admitting: Podiatry

## 2022-01-20 ENCOUNTER — Ambulatory Visit: Payer: Medicare HMO | Admitting: Podiatry

## 2022-01-20 DIAGNOSIS — B351 Tinea unguium: Secondary | ICD-10-CM | POA: Diagnosis not present

## 2022-01-20 DIAGNOSIS — E119 Type 2 diabetes mellitus without complications: Secondary | ICD-10-CM | POA: Diagnosis not present

## 2022-01-20 DIAGNOSIS — M79674 Pain in right toe(s): Secondary | ICD-10-CM | POA: Diagnosis not present

## 2022-01-20 DIAGNOSIS — M79675 Pain in left toe(s): Secondary | ICD-10-CM | POA: Diagnosis not present

## 2022-01-20 NOTE — Progress Notes (Signed)
  Subjective:  Patient ID: Toni Stone, female    DOB: 12-24-1945,  MRN: [id number]  Chief Complaint  Patient presents with   Nail Problem     Diabetic foot care    76 y.o. female returns with the above complaint. History confirmed with patient.  Blood sugars under good control, she is doing well no new issues otherwise  Objective:  Physical Exam: warm, good capillary refill, no trophic changes or ulcerative lesions, normal DP and PT pulses, normal monofilament exam and normal sensory exam. Onychomycosis x10  Assessment:   1. Pain due to onychomycosis of toenails of both feet   2. Type 2 diabetes mellitus without complication, without long-term current use of insulin ([place])        Plan:  Patient was evaluated and treated and all questions answered.  Discussed the etiology and treatment options for the condition in detail with the patient. Educated patient on the topical and oral treatment options for mycotic nails. Recommended debridement of the nails today. Sharp and mechanical debridement performed of all painful and mycotic nails today. Nails debrided in length and thickness using a nail nipper to level of comfort. Discussed treatment options including appropriate shoe gear. Follow up as needed for painful nails.  Patient educated on diabetes. Discussed proper diabetic foot care and discussed risks and complications of disease. Educated patient in depth on reasons to return to the office immediately should he/she discover anything concerning or new on the feet. All questions answered. Discussed proper shoes as well.    No follow-ups on file.

## 2022-02-25 ENCOUNTER — Encounter (INDEPENDENT_AMBULATORY_CARE_PROVIDER_SITE_OTHER): Payer: Medicare HMO | Admitting: Family Medicine

## 2022-02-25 DIAGNOSIS — Z0289 Encounter for other administrative examinations: Secondary | ICD-10-CM

## 2022-04-26 ENCOUNTER — Other Ambulatory Visit: Payer: Self-pay | Admitting: Hematology

## 2022-04-26 DIAGNOSIS — R928 Other abnormal and inconclusive findings on diagnostic imaging of breast: Secondary | ICD-10-CM

## 2022-04-28 ENCOUNTER — Encounter: Payer: Self-pay | Admitting: Podiatry

## 2022-04-28 ENCOUNTER — Ambulatory Visit: Payer: Medicare HMO | Admitting: Podiatry

## 2022-04-28 DIAGNOSIS — B351 Tinea unguium: Secondary | ICD-10-CM | POA: Diagnosis not present

## 2022-04-28 DIAGNOSIS — M79675 Pain in left toe(s): Secondary | ICD-10-CM | POA: Diagnosis not present

## 2022-04-28 DIAGNOSIS — E119 Type 2 diabetes mellitus without complications: Secondary | ICD-10-CM | POA: Diagnosis not present

## 2022-04-28 DIAGNOSIS — M79674 Pain in right toe(s): Secondary | ICD-10-CM

## 2022-04-28 NOTE — Progress Notes (Signed)

## 2022-05-09 NOTE — Progress Notes (Unsigned)
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 No pathogenic variants identified on the [facility] Multi-Cancer Panel+RNA. The report date is 11/12/2020.  The Multi-Cancer Panel + RNA offered by [facility] includes sequencing and/or deletion duplication testing of the following 84 genes: AIP, ALK, APC, ATM, AXIN2,BAP1,  BARD1, BLM, BMPR1A, BRCA1, BRCA2, BRIP1, CASR, CDC73, CDH1, CDK4, CDKN1B, CDKN1C, CDKN2A (p14ARF), CDKN2A (p16INK4a), CEBPA, CHEK2, CTNNA1, DICER1, DIS3L2, EGFR (c.2369C>T, p.Thr790Met variant only), EPCAM (Deletion/duplication testing only), FH, FLCN, GATA2, GPC3, GREM1 (Promoter region deletion/duplication testing only), HOXB13 (c.251G>A, p.Gly84Glu), HRAS, KIT, MAX, MEN1, MET, MITF (c.952G>A, p.Glu318Lys variant only), MLH1, MSH2, MSH3, MSH6, MUTYH, NBN, NF1, NF2, NTHL1, PALB2, PDGFRA, PHOX2B, PMS2, POLD1, POLE, POT1, PRKAR1A, PTCH1, PTEN, RAD50, RAD51C, RAD51D, RB1, RECQL4, RET, RUNX1, SDHAF2, SDHA (sequence changes only), SDHB, SDHC, SDHD, SMAD4, SMARCA4, SMARCB1, SMARCE1, STK11, SUFU, TERC, TERT, TMEM127, TP53, TSC1, TSC2, VHL, WRN and WT1.   Malignant neoplasm of left breast in female, estrogen receptor negative  ([place])  08/23/2018 Cancer Staging   Staging form: Breast, AJCC 8th Edition - Pathologic stage from 08/23/2018: Stage IIA (pT2, pN0, cM0, G3, ER-, PR-, HER2-) - Signed by Alla Feeling, NP on 10/23/2020 Stage prefix: Initial diagnosis Histologic grading system: 3 grade system   10/23/2020 Initial Diagnosis   Malignant neoplasm of left breast in female, estrogen receptor negative ([place])    Genetic Testing   Negative genetic testing. No pathogenic variants identified on the [facility] Multi-Cancer Panel+RNA. The report date is 11/12/2020.  The Multi-Cancer Panel + RNA offered by [facility] includes sequencing and/or deletion duplication testing of the following 84 genes: AIP, ALK, APC, ATM, AXIN2,BAP1,  BARD1, BLM, BMPR1A, BRCA1, BRCA2, BRIP1, CASR, CDC73, CDH1, CDK4, CDKN1B, CDKN1C, CDKN2A (p14ARF), CDKN2A (p16INK4a), CEBPA, CHEK2, CTNNA1, DICER1, DIS3L2, EGFR (c.2369C>T, p.Thr790Met variant only), EPCAM (Deletion/duplication testing only), FH, FLCN, GATA2, GPC3, GREM1 (Promoter region deletion/duplication testing only), HOXB13 (c.251G>A, p.Gly84Glu), HRAS, KIT, MAX, MEN1, MET, MITF (c.952G>A, p.Glu318Lys variant only), MLH1, MSH2, MSH3, MSH6, MUTYH, NBN, NF1, NF2, NTHL1, PALB2, PDGFRA, PHOX2B, PMS2, POLD1, POLE, POT1, PRKAR1A, PTCH1, PTEN, RAD50, RAD51C, RAD51D, RB1, RECQL4, RET, RUNX1, SDHAF2, SDHA (sequence changes only), SDHB, SDHC, SDHD, SMAD4, SMARCA4, SMARCB1, SMARCE1, STK11, SUFU, TERC, TERT, TMEM127, TP53, TSC1, TSC2, VHL, WRN and WT1.     CURRENT THERAPY: surveillance   INTERVAL HISTORY: Ms. Mentzer returns for follow up as scheduled. Last seen by Dr. Burr Medico 11/04/21. She is under close surveillance with q6 month mm/US for now, next scheduled 05/31/22.  She continues self exams which are stable.  Denies new lump/mass, nipple discharge or inversion, or skin change.  She has intermittent constipation questions if it is related to her vitamins/supplements.  Knee pain is stable, denies new or worsening bone  pain.  Denies recent infection, new cough,  chest pain, dyspnea, or any other new specific complaints.  All other systems were reviewed with the patient and are negative.  MEDICAL HISTORY:  Past Medical History:  Diagnosis Date   Anemia    Breast cancer ([place])    Chronic kidney disease    Diabetes mellitus without complication ([facility])    Family history of bladder cancer    Family history of breast cancer    Hypertension    Neuromuscular disorder ([place])    Tremor   Personal history of breast cancer    Personal history of chemotherapy    Personal history of radiation therapy     SURGICAL HISTORY: Past Surgical History:  Procedure Laterality Date   ABDOMINAL HYSTERECTOMY     Age 60s, for ectopic pregnancy   BREAST BIOPSY Left 04/2021   BREAST LUMPECTOMY Right 06/23/2004   pT1cN0M0 ER/PR + HER2 - stage IA   BREAST LUMPECTOMY Left 08/23/2018   pT2N0M0 ER/PR/HER2 neg stage IIA    I have reviewed the social history and family history with the patient and they are unchanged from previous note.  ALLERGIES:  is allergic to metformin.  MEDICATIONS:  Current Outpatient Medications  Medication Sig Dispense Refill   Blood Glucose Monitoring Suppl (GLUCOCOM BLOOD GLUCOSE MONITOR) DEVI 1 each by Misc.(Non-Drug; Combo Route) route daily. May substitute for insurance     febuxostat (ULORIC) 40 MG tablet Take 1 tablet by mouth daily.     glucose blood ([employer] BLOOD GLUCOSE TEST) test strip 1 each by Other route daily.     insulin glargine (LANTUS) 100 UNIT/ML injection Inject into the skin.     Multiple Vitamin (MULTIVITAMIN) tablet Take 1 tablet by mouth daily.     pregabalin (LYRICA) 75 MG capsule Take by mouth.     primidone (MYSOLINE) 250 MG tablet Take by mouth.     propranolol ER (INDERAL LA) 120 MG 24 hr capsule Take 120 mg by mouth daily.     rosuvastatin (CRESTOR) 10 MG tablet Take 20 mg by mouth at bedtime.     sitaGLIPtin (JANUVIA) 50 MG tablet Take 1 tablet by mouth daily.      triamterene-hydrochlorothiazide (MAXZIDE-25) 37.5-25 MG tablet Take 1 tablet by mouth daily.     No current facility-administered medications for this visit.    PHYSICAL EXAMINATION: ECOG PERFORMANCE STATUS: 1 - Symptomatic but completely ambulatory  Vitals:   05/10/22 1004 05/10/22 1019  BP: (!) 156/89 132/78  Pulse: 67   Resp: 16   Temp: 98.2 F (36.8 C)   SpO2: 94% 97%   Filed Weights   05/10/22 1004  Weight: 238 lb 1.6 oz (108 kg)    GENERAL:alert, no distress and comfortable SKIN: No rash EYES: sclera clear NECK: Without mass LYMPH:  no palpable cervical or supraclavicular lymphadenopathy LUNGS:  normal breathing effort HEART:  no lower extremity edema ABDOMEN:abdomen soft, non-tender and normal bowel sounds NEURO: alert & oriented x 3 with fluent speech Breast exam: Inspection shows larger left breast, no bilateral nipple discharge or inversion.  Mild skin retraction at the upper inner right breast, on palpation there is a small firm nodule at the site, stable per patient.  No other palpable mass or nodularity in either breast or axilla that I could appreciate.  Incisions completely healed.  Moderate central left breast lymphedema  LABORATORY DATA:  I have reviewed the data as listed    Latest Ref Rng & Units 05/10/2022    9:58 AM 11/04/2021   11:46 AM 05/06/2021  11:53 AM  CBC  WBC 4.0 - 10.5 K/uL 4.7  4.2  6.0   Hemoglobin 12.0 - 15.0 g/dL 11.6  11.1  11.2   Hematocrit 36.0 - 46.0 % 34.8  33.7  34.5   Platelets 150 - 400 K/uL 189  174  192         Latest Ref Rng & Units 05/10/2022    9:58 AM 11/04/2021   11:46 AM 05/06/2021   11:53 AM  CMP  Glucose 70 - 99 mg/dL 113  97  75   BUN 8 - 23 mg/dL 36  35  38   Creatinine 0.44 - 1.00 mg/dL 1.31  1.29  1.30   Sodium 135 - 145 mmol/L 140  143  144   Potassium 3.5 - 5.1 mmol/L 4.3  5.1  4.3   Chloride 98 - 111 mmol/L 102  107  105   CO2 22 - 32 mmol/L 32  33  29   Calcium 8.9 - 10.3 mg/dL 9.7  9.3  9.6    Total Protein 6.5 - 8.1 g/dL 8.2  7.4  7.9   Total Bilirubin 0.3 - 1.2 mg/dL 0.3  0.2  <0.2   Alkaline Phos 38 - 126 U/L 107  112  114   AST 15 - 41 U/L _0 ALT 0 - 44 U/L _1 RADIOGRAPHIC STUDIES: I have personally reviewed the radiological images as listed and agreed with the findings in the report. No results found.   ASSESSMENT & PLAN:  76 yo female with    1.  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  Staging PET scan was negative for distant metastasis.  She completed adjuvant chemo (Taxotere/carboplatin x6 cycles per outside records) and radiation.   -Due to triple negative breast cancer, she would not benefit from antiestrogen therapy.  Currently on surveillance alone -Ms. Golladay is clinically doing well.  Breast exam reveals lymphedema on the left, and skin retraction with a firm nodule at the prior lumpectomy site in the upper inner right breast, stable per patient and was noted on my initial exam 10/23/2020; likely scar tissue/benign.  Labs are stable -She is scheduled for bilateral mammogram and surveillance left breast ultrasound on 05/31/2022, we will call her with results -Continue breast cancer surveillance -Follow-up in 6 months, or sooner if needed   2.  Malignant neoplasm right breast, stage 1A, pT1cN0M0, ER/PR positive, HER2 negative, grade 2 -Diagnosed 06/23/2004, s/p lumpectomy with axillary lymph node dissection, ?adjuvant chemo, radiation, and 5 years of antiestrogen therapy with anastrozole -Continue surveillance   3.  Genetics -Given patient's history of bilateral breast cancer, and family history of sister with breast and bladder cancer, and mother with cancer, she qualifies for genetics -testing 10/27/20, negative    4.  Chronic right knee pain -Ambulates with a  cane. -Denies any other new/worsening bone or joint pain   5.  DM, CKD, anemia, HTN, HL -Followed by PCP -HG A1c 7.8, BG 172, SCR 1.49, K5.6 on 09/26/2020 -Per outside records from [place], [employer] on 06/04/2020 showed Hgb 10.9, HCT 32.4 otherwise normal CBC/differential.  She likely has anemia of chronic disease from CKD and other comorbidities -hgb 11.6 today, stable and mild       6.  Health maintenance  disease prevention -We reviewed staying up-to-date on age-appropriate cancer screenings and vaccines, she received COVID-vaccine and booster -Colonoscopy in 2014 and EGD in 2015 in [place] to be her last, per patient -Reviewed the importance of healthy diet/weight, exercise as tolerated, avoiding smoking and limiting alcohol -Continue follow-up with PCP  PLAN: -labs reviewed -Continue breast cancer surveillance -Bilateral mammo/L Br [place] 05/31/22 as scheduled, will call with results -F/up in 6 months, or sooner if needed    All questions were answered. The patient knows to call the clinic with any problems, questions or concerns. No barriers to learning were detected.     Alla Feeling, NP 05/11/22

## 2022-05-10 ENCOUNTER — Encounter: Payer: Self-pay | Admitting: Nurse Practitioner

## 2022-05-10 ENCOUNTER — Inpatient Hospital Stay: Payer: Medicare HMO | Attending: Nurse Practitioner | Admitting: Nurse Practitioner

## 2022-05-10 ENCOUNTER — Inpatient Hospital Stay: Payer: Medicare HMO

## 2022-05-10 VITALS — BP 132/78 | HR 67 | Temp 98.2°F | Resp 16 | Wt 238.1 lb

## 2022-05-10 DIAGNOSIS — C50912 Malignant neoplasm of unspecified site of left female breast: Secondary | ICD-10-CM | POA: Insufficient documentation

## 2022-05-10 DIAGNOSIS — Z170 Estrogen receptor positive status [ER+]: Secondary | ICD-10-CM | POA: Diagnosis not present

## 2022-05-10 DIAGNOSIS — Z171 Estrogen receptor negative status [ER-]: Secondary | ICD-10-CM | POA: Diagnosis not present

## 2022-05-10 DIAGNOSIS — M25561 Pain in right knee: Secondary | ICD-10-CM | POA: Diagnosis not present

## 2022-05-10 DIAGNOSIS — C50911 Malignant neoplasm of unspecified site of right female breast: Secondary | ICD-10-CM

## 2022-05-10 DIAGNOSIS — Z923 Personal history of irradiation: Secondary | ICD-10-CM | POA: Diagnosis not present

## 2022-05-10 DIAGNOSIS — E1122 Type 2 diabetes mellitus with diabetic chronic kidney disease: Secondary | ICD-10-CM | POA: Diagnosis not present

## 2022-05-10 DIAGNOSIS — I129 Hypertensive chronic kidney disease with stage 1 through stage 4 chronic kidney disease, or unspecified chronic kidney disease: Secondary | ICD-10-CM | POA: Insufficient documentation

## 2022-05-10 DIAGNOSIS — D649 Anemia, unspecified: Secondary | ICD-10-CM | POA: Diagnosis not present

## 2022-05-10 DIAGNOSIS — Z8052 Family history of malignant neoplasm of bladder: Secondary | ICD-10-CM | POA: Insufficient documentation

## 2022-05-10 DIAGNOSIS — Z90710 Acquired absence of both cervix and uterus: Secondary | ICD-10-CM | POA: Insufficient documentation

## 2022-05-10 DIAGNOSIS — N189 Chronic kidney disease, unspecified: Secondary | ICD-10-CM | POA: Diagnosis not present

## 2022-05-10 DIAGNOSIS — K5900 Constipation, unspecified: Secondary | ICD-10-CM | POA: Insufficient documentation

## 2022-05-10 DIAGNOSIS — Z803 Family history of malignant neoplasm of breast: Secondary | ICD-10-CM | POA: Diagnosis not present

## 2022-05-10 LAB — CBC WITH DIFFERENTIAL (CANCER CENTER ONLY)
Abs Immature Granulocytes: 0 10*3/uL (ref 0.00–0.07)
Basophils Absolute: 0 10*3/uL (ref 0.0–0.1)
Basophils Relative: 0 %
Eosinophils Absolute: 0.2 10*3/uL (ref 0.0–0.5)
Eosinophils Relative: 5 %
HCT: 34.8 % — ABNORMAL LOW (ref 36.0–46.0)
Hemoglobin: 11.6 g/dL — ABNORMAL LOW (ref 12.0–15.0)
Immature Granulocytes: 0 %
Lymphocytes Relative: 21 %
Lymphs Abs: 1 10*3/uL (ref 0.7–4.0)
MCH: 30.7 pg (ref 26.0–34.0)
MCHC: 33.3 g/dL (ref 30.0–36.0)
MCV: 92.1 fL (ref 80.0–100.0)
Monocytes Absolute: 0.4 10*3/uL (ref 0.1–1.0)
Monocytes Relative: 8 %
Neutro Abs: 3.1 10*3/uL (ref 1.7–7.7)
Neutrophils Relative %: 66 %
Platelet Count: 189 10*3/uL (ref 150–400)
RBC: 3.78 MIL/uL — ABNORMAL LOW (ref 3.87–5.11)
RDW: 13.7 % (ref 11.5–15.5)
WBC Count: 4.7 10*3/uL (ref 4.0–10.5)
nRBC: 0 % (ref 0.0–0.2)

## 2022-05-10 LAB — CMP (CANCER CENTER ONLY)
ALT: 12 U/L (ref 0–44)
AST: 17 U/L (ref 15–41)
Albumin: 4 g/dL (ref 3.5–5.0)
Alkaline Phosphatase: 107 U/L (ref 38–126)
Anion gap: 6 (ref 5–15)
BUN: 36 mg/dL — ABNORMAL HIGH (ref 8–23)
CO2: 32 mmol/L (ref 22–32)
Calcium: 9.7 mg/dL (ref 8.9–10.3)
Chloride: 102 mmol/L (ref 98–111)
Creatinine: 1.31 mg/dL — ABNORMAL HIGH (ref 0.44–1.00)
GFR, Estimated: 43 mL/min — ABNORMAL LOW (ref >60)
Glucose, Bld: 113 mg/dL — ABNORMAL HIGH (ref 70–99)
Potassium: 4.3 mmol/L (ref 3.5–5.1)
Sodium: 140 mmol/L (ref 135–145)
Total Bilirubin: 0.3 mg/dL (ref 0.3–1.2)
Total Protein: 8.2 g/dL — ABNORMAL HIGH (ref 6.5–8.1)

## 2022-05-11 ENCOUNTER — Encounter: Payer: Self-pay | Admitting: Nurse Practitioner

## 2022-05-11 LAB — CANCER ANTIGEN 27.29: CA 27.29: 20.3 U/mL (ref 0.0–38.6)

## 2022-05-26 ENCOUNTER — Other Ambulatory Visit: Payer: Self-pay | Admitting: Hematology

## 2022-05-26 DIAGNOSIS — N6320 Unspecified lump in the left breast, unspecified quadrant: Secondary | ICD-10-CM

## 2022-05-31 ENCOUNTER — Ambulatory Visit
Admission: RE | Admit: 2022-05-31 | Discharge: 2022-05-31 | Disposition: A | Discharge status: Home / Self Care | Payer: Medicare HMO | Source: Ambulatory Visit | Attending: Hematology | Admitting: Hematology

## 2022-05-31 ENCOUNTER — Other Ambulatory Visit: Payer: Medicare HMO

## 2022-05-31 DIAGNOSIS — N6320 Unspecified lump in the left breast, unspecified quadrant: Secondary | ICD-10-CM

## 2022-06-01 ENCOUNTER — Encounter (INDEPENDENT_AMBULATORY_CARE_PROVIDER_SITE_OTHER): Payer: Medicare HMO | Admitting: Family Medicine

## 2022-06-15 ENCOUNTER — Encounter: Payer: Self-pay | Admitting: Nurse Practitioner

## 2022-07-17 LAB — COLOGUARD: COLOGUARD: NEGATIVE

## 2022-08-09 ENCOUNTER — Encounter: Payer: Self-pay | Admitting: Podiatry

## 2022-08-09 ENCOUNTER — Ambulatory Visit: Payer: Medicare HMO | Admitting: Podiatry

## 2022-08-09 DIAGNOSIS — E119 Type 2 diabetes mellitus without complications: Secondary | ICD-10-CM | POA: Diagnosis not present

## 2022-08-09 DIAGNOSIS — B351 Tinea unguium: Secondary | ICD-10-CM

## 2022-08-09 DIAGNOSIS — M79675 Pain in left toe(s): Secondary | ICD-10-CM | POA: Diagnosis not present

## 2022-08-09 DIAGNOSIS — M79674 Pain in right toe(s): Secondary | ICD-10-CM

## 2022-08-09 NOTE — Progress Notes (Signed)

## 2022-11-08 ENCOUNTER — Other Ambulatory Visit: Payer: Self-pay

## 2022-11-08 DIAGNOSIS — C50911 Malignant neoplasm of unspecified site of right female breast: Secondary | ICD-10-CM

## 2022-11-08 DIAGNOSIS — C50912 Malignant neoplasm of unspecified site of left female breast: Secondary | ICD-10-CM

## 2022-11-09 ENCOUNTER — Inpatient Hospital Stay (HOSPITAL_BASED_OUTPATIENT_CLINIC_OR_DEPARTMENT_OTHER): Payer: Medicare HMO | Admitting: Nurse Practitioner

## 2022-11-09 ENCOUNTER — Encounter: Payer: Self-pay | Admitting: Nurse Practitioner

## 2022-11-09 ENCOUNTER — Inpatient Hospital Stay: Payer: Medicare HMO | Attending: Nurse Practitioner

## 2022-11-09 VITALS — BP 137/67 | HR 86 | Temp 97.5°F | Resp 18 | Wt 239.7 lb

## 2022-11-09 DIAGNOSIS — C50912 Malignant neoplasm of unspecified site of left female breast: Secondary | ICD-10-CM

## 2022-11-09 DIAGNOSIS — E1122 Type 2 diabetes mellitus with diabetic chronic kidney disease: Secondary | ICD-10-CM | POA: Diagnosis not present

## 2022-11-09 DIAGNOSIS — M25561 Pain in right knee: Secondary | ICD-10-CM | POA: Insufficient documentation

## 2022-11-09 DIAGNOSIS — N189 Chronic kidney disease, unspecified: Secondary | ICD-10-CM | POA: Diagnosis not present

## 2022-11-09 DIAGNOSIS — I129 Hypertensive chronic kidney disease with stage 1 through stage 4 chronic kidney disease, or unspecified chronic kidney disease: Secondary | ICD-10-CM | POA: Insufficient documentation

## 2022-11-09 DIAGNOSIS — Z170 Estrogen receptor positive status [ER+]: Secondary | ICD-10-CM

## 2022-11-09 DIAGNOSIS — Z171 Estrogen receptor negative status [ER-]: Secondary | ICD-10-CM | POA: Diagnosis not present

## 2022-11-09 DIAGNOSIS — C50911 Malignant neoplasm of unspecified site of right female breast: Secondary | ICD-10-CM

## 2022-11-09 DIAGNOSIS — Z923 Personal history of irradiation: Secondary | ICD-10-CM | POA: Diagnosis not present

## 2022-11-09 DIAGNOSIS — Z90710 Acquired absence of both cervix and uterus: Secondary | ICD-10-CM | POA: Diagnosis not present

## 2022-11-09 DIAGNOSIS — Z803 Family history of malignant neoplasm of breast: Secondary | ICD-10-CM | POA: Diagnosis not present

## 2022-11-09 DIAGNOSIS — G8929 Other chronic pain: Secondary | ICD-10-CM | POA: Diagnosis not present

## 2022-11-09 DIAGNOSIS — D649 Anemia, unspecified: Secondary | ICD-10-CM | POA: Diagnosis not present

## 2022-11-09 DIAGNOSIS — Z8052 Family history of malignant neoplasm of bladder: Secondary | ICD-10-CM | POA: Diagnosis not present

## 2022-11-09 LAB — CBC WITH DIFFERENTIAL (CANCER CENTER ONLY)
Abs Immature Granulocytes: 0.01 10*3/uL (ref 0.00–0.07)
Basophils Absolute: 0 10*3/uL (ref 0.0–0.1)
Basophils Relative: 1 %
Eosinophils Absolute: 0.1 10*3/uL (ref 0.0–0.5)
Eosinophils Relative: 3 %
HCT: 33.8 % — ABNORMAL LOW (ref 36.0–46.0)
Hemoglobin: 11.1 g/dL — ABNORMAL LOW (ref 12.0–15.0)
Immature Granulocytes: 0 %
Lymphocytes Relative: 23 %
Lymphs Abs: 1.2 10*3/uL (ref 0.7–4.0)
MCH: 29.9 pg (ref 26.0–34.0)
MCHC: 32.8 g/dL (ref 30.0–36.0)
MCV: 91.1 fL (ref 80.0–100.0)
Monocytes Absolute: 0.5 10*3/uL (ref 0.1–1.0)
Monocytes Relative: 9 %
Neutro Abs: 3.3 10*3/uL (ref 1.7–7.7)
Neutrophils Relative %: 64 %
Platelet Count: 195 10*3/uL (ref 150–400)
RBC: 3.71 MIL/uL — ABNORMAL LOW (ref 3.87–5.11)
RDW: 14 % (ref 11.5–15.5)
WBC Count: 5.1 10*3/uL (ref 4.0–10.5)
nRBC: 0 % (ref 0.0–0.2)

## 2022-11-09 LAB — CMP (CANCER CENTER ONLY)
ALT: 16 U/L (ref 0–44)
AST: 18 U/L (ref 15–41)
Albumin: 3.9 g/dL (ref 3.5–5.0)
Alkaline Phosphatase: 110 U/L (ref 38–126)
Anion gap: 6 (ref 5–15)
BUN: 36 mg/dL — ABNORMAL HIGH (ref 8–23)
CO2: 31 mmol/L (ref 22–32)
Calcium: 9.3 mg/dL (ref 8.9–10.3)
Chloride: 104 mmol/L (ref 98–111)
Creatinine: 1.19 mg/dL — ABNORMAL HIGH (ref 0.44–1.00)
GFR, Estimated: 48 mL/min — ABNORMAL LOW (ref >60)
Glucose, Bld: 123 mg/dL — ABNORMAL HIGH (ref 70–99)
Potassium: 4.8 mmol/L (ref 3.5–5.1)
Sodium: 141 mmol/L (ref 135–145)
Total Bilirubin: 0.2 mg/dL — ABNORMAL LOW (ref 0.3–1.2)
Total Protein: 7.7 g/dL (ref 6.5–8.1)

## 2022-11-09 NOTE — Progress Notes (Signed)
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 No pathogenic variants identified on the [facility] Multi-Cancer Panel+RNA. The report date is 11/12/2020.  The Multi-Cancer Panel + RNA offered by [facility] includes sequencing and/or deletion duplication testing of the following 84 genes: AIP, ALK, APC, ATM, AXIN2,BAP1,  BARD1, BLM, BMPR1A, BRCA1, BRCA2, BRIP1, CASR, CDC73, CDH1, CDK4, CDKN1B, CDKN1C, CDKN2A (p14ARF), CDKN2A (p16INK4a), CEBPA, CHEK2, CTNNA1, DICER1, DIS3L2, EGFR (c.2369C>T, p.Thr790Met variant only), EPCAM (Deletion/duplication testing only), FH, FLCN, GATA2, GPC3, GREM1 (Promoter region deletion/duplication testing only), HOXB13 (c.251G>A, p.Gly84Glu), HRAS, KIT, MAX, MEN1, MET, MITF (c.952G>A, p.Glu318Lys variant only), MLH1, MSH2, MSH3, MSH6, MUTYH, NBN, NF1, NF2, NTHL1, PALB2, PDGFRA, PHOX2B, PMS2, POLD1, POLE, POT1, PRKAR1A, PTCH1, PTEN, RAD50, RAD51C, RAD51D, RB1, RECQL4, RET, RUNX1, SDHAF2, SDHA (sequence changes only), SDHB, SDHC, SDHD, SMAD4, SMARCA4, SMARCB1, SMARCE1, STK11, SUFU, TERC, TERT, TMEM127, TP53, TSC1, TSC2, VHL, WRN and WT1.   Malignant neoplasm of left breast in female, estrogen receptor negative (HCC)  08/23/2018 Cancer Staging   Staging form: Breast, AJCC 8th Edition - Pathologic stage  from 08/23/2018: Stage IIA (pT2, pN0, cM0, G3, ER-, PR-, HER2-) - Signed by Pollyann Samples, NP on 10/23/2020 Stage prefix: Initial diagnosis Histologic grading system: 3 grade system   10/23/2020 Initial Diagnosis   Malignant neoplasm of left breast in female, estrogen receptor negative (HCC)    Genetic Testing   Negative genetic testing. No pathogenic variants identified on the [facility] Multi-Cancer Panel+RNA. The report date is 11/12/2020.  The Multi-Cancer Panel + RNA offered by [facility] includes sequencing and/or deletion duplication testing of the following 84 genes: AIP, ALK, APC, ATM, AXIN2,BAP1,  BARD1, BLM, BMPR1A, BRCA1, BRCA2, BRIP1, CASR, CDC73, CDH1, CDK4, CDKN1B, CDKN1C, CDKN2A (p14ARF), CDKN2A (p16INK4a), CEBPA, CHEK2, CTNNA1, DICER1, DIS3L2, EGFR (c.2369C>T, p.Thr790Met variant only), EPCAM (Deletion/duplication testing only), FH, FLCN, GATA2, GPC3, GREM1 (Promoter region deletion/duplication testing only), HOXB13 (c.251G>A, p.Gly84Glu), HRAS, KIT, MAX, MEN1, MET, MITF (c.952G>A, p.Glu318Lys variant only), MLH1, MSH2, MSH3, MSH6, MUTYH, NBN, NF1, NF2, NTHL1, PALB2, PDGFRA, PHOX2B, PMS2, POLD1, POLE, POT1, PRKAR1A, PTCH1, PTEN, RAD50, RAD51C, RAD51D, RB1, RECQL4, RET, RUNX1, SDHAF2, SDHA (sequence changes only), SDHB, SDHC, SDHD, SMAD4, SMARCA4, SMARCB1, SMARCE1, STK11, SUFU, TERC, TERT, TMEM127, TP53, TSC1, TSC2, VHL, WRN and WT1.      CURRENT THERAPY: Surveillance   INTERVAL HISTORY Ms. Toni Stone returns for follow up as scheduled. Last seen by me 05/10/22. Mammo/L [place] 05/31/22 was stable, nothing suspicious.  Her own self breast exams without change.  Denies new lump/mass, nipple discharge or inversion, or skin change.  Denies bone or joint pain, unintentional weight loss, or significant fatigue.  She still has tremors, Lyrica was not helpful.  ROS  All other systems reviewed and negative  Past Medical History:  Diagnosis Date   Anemia    Breast cancer (HCC)    Chronic kidney disease  Diabetes mellitus without complication (HCC)    Family history of bladder cancer    Family history of breast cancer    Hypertension    Neuromuscular disorder (HCC)    Tremor   Personal history of breast cancer    Personal history of chemotherapy    Personal history of radiation therapy      Past Surgical History:  Procedure Laterality Date   ABDOMINAL HYSTERECTOMY     Age 27s, for ectopic pregnancy   BREAST BIOPSY Left 04/2021   BREAST LUMPECTOMY Right 06/23/2004   pT1cN0M0 ER/PR + HER2 - stage IA   BREAST LUMPECTOMY Left 08/23/2018   pT2N0M0 ER/PR/HER2 neg stage IIA     Outpatient Encounter Medications as of 11/09/2022  Medication Sig   Blood Glucose Monitoring Suppl (GLUCOCOM BLOOD GLUCOSE MONITOR) DEVI 1 each by Misc.(Non-Drug; Combo Route) route daily. May substitute for insurance   febuxostat (ULORIC) 40 MG tablet Take 1 tablet by mouth daily.   glucose blood ([facility] BLOOD GLUCOSE TEST) test strip 1 each by Other route daily.   insulin glargine (LANTUS) 100 UNIT/ML injection Inject into the skin.   Multiple Vitamin (MULTIVITAMIN) tablet Take 1 tablet by mouth daily.   primidone (MYSOLINE) 250 MG tablet Take by mouth.   propranolol ER (INDERAL LA) 120 MG 24 hr capsule Take 120 mg by mouth daily.   rosuvastatin (CRESTOR) 10 MG tablet Take 20 mg by mouth at bedtime.   sitaGLIPtin (JANUVIA) 50 MG tablet Take 1 tablet by mouth daily.   triamterene-hydrochlorothiazide (MAXZIDE-25) 37.5-25 MG tablet Take 1 tablet by mouth daily.   pregabalin (LYRICA) 75 MG capsule Take by mouth. (Patient not taking: Reported on 11/09/2022)   No facility-administered encounter medications on file as of 11/09/2022.     Today's Vitals   11/09/22 1000 11/09/22 1019  BP:  137/67  Pulse:  86  Resp:  18  Temp:  (!) 97.5 F (36.4 C)  TempSrc:  Temporal  SpO2:  98%  Weight:  239 lb 11.2 oz (108.7 kg)  PainSc: 0-No pain    There is no height or weight on file to calculate BMI.   PHYSICAL  EXAM GENERAL:alert, no distress and comfortable SKIN: no rash  EYES: sclera clear NECK: without mass LYMPH:  no palpable cervical or supraclavicular lymphadenopathy  LUNGS: clear with normal breathing effort HEART: regular rate & rhythm, no lower extremity edema ABDOMEN: abdomen soft, non-tender and normal bowel sounds NEURO: alert & oriented x 3 with fluent speech, resting tremor  Breast exam: No nipple discharge or inversion.  S/p bilateral breast surgeries, incisions completely healed.  In the right breast there is a small firm nodule at the scar.  Mild retraction at the left surgical site and central lymphedema without any other palpable mass or nodularity in either breast or axilla that I could appreciate.    CBC    Component Value Date/Time   WBC 5.1 11/09/2022 0943   RBC 3.71 (L) 11/09/2022 0943   HGB 11.1 (L) 11/09/2022 0943   HCT 33.8 (L) 11/09/2022 0943   PLT 195 11/09/2022 0943   MCV 91.1 11/09/2022 0943   MCH 29.9 11/09/2022 0943   MCHC 32.8 11/09/2022 0943   RDW 14.0 11/09/2022 0943   LYMPHSABS 1.2 11/09/2022 0943   MONOABS 0.5 11/09/2022 0943   EOSABS 0.1 11/09/2022 0943   BASOSABS 0.0 11/09/2022 0943     CMP     Component Value Date/Time   NA 141 11/09/2022 0943   K 4.8 11/09/2022  0943   CL 104 11/09/2022 0943   CO2 31 11/09/2022 0943   GLUCOSE 123 (H) 11/09/2022 0943   BUN 36 (H) 11/09/2022 0943   CREATININE 1.19 (H) 11/09/2022 0943   CALCIUM 9.3 11/09/2022 0943   PROT 7.7 11/09/2022 0943   ALBUMIN 3.9 11/09/2022 0943   AST 18 11/09/2022 0943   ALT 16 11/09/2022 0943   ALKPHOS 110 11/09/2022 0943   BILITOT 0.2 (L) 11/09/2022 0943   GFRNONAA 48 (L) 11/09/2022 0943     ASSESSMENT & PLAN:76 yo female with    1.  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  Staging PET scan was  negative for distant metastasis.  She completed adjuvant chemo (Taxotere/carboplatin x6 cycles per outside records) and radiation.   -Due to triple negative breast cancer, she would not benefit from antiestrogen therapy.  -Ms. Leckie is clinically doing well.  Breast exam is benign, labs are stable.  Overall no clinical concern for recurrence.  Continue breast cancer surveillance -Follow-up in 6 months, or sooner if needed  -bilateral mammogram and left ultrasound in December   2.  Malignant neoplasm right breast, stage 1A, pT1cN0M0, ER/PR positive, HER2 negative, grade 2 -Diagnosed 06/23/2004, s/p lumpectomy with axillary lymph node dissection, ?adjuvant chemo, radiation, and 5 years of antiestrogen therapy with anastrozole -Continue surveillance   3.  Genetics -Given patient's history of bilateral breast cancer, and family history of sister with breast and bladder cancer, and mother with cancer, she qualifies for genetics -testing 10/27/20, negative    4.  Chronic right knee pain -Ambulates with a cane. -Denies any other new/worsening bone or joint pain   5.  DM, CKD, anemia, HTN, HL -Followed by PCP -HG A1c 7.8, BG 172, SCR 1.49, K5.6 on 09/26/2020 -Per outside records from [place], CBC on 06/04/2020 showed Hgb 10.9, HCT 32.4 otherwise normal CBC/differential.  She likely has anemia of chronic disease from CKD and other comorbidities          6.  Health maintenance disease prevention -We reviewed staying up-to-date on age-appropriate cancer screenings and vaccines, she received COVID-vaccine and booster -Colonoscopy in 2014 and EGD in 2015 in [place] to be her last, per patient -Reviewed the importance of healthy diet/weight, exercise as tolerated, avoiding smoking and limiting alcohol -Continue follow-up with PCP   PLAN: -Labs reviewed -Continue breast cancer surveillance -Follow-up in 6 months, or sooner if needed -Bilateral mammogram and left ultrasound in December  Orders Placed This  Encounter  Procedures   MM DIAG BREAST TOMO BILATERAL    Standing Status:   Future    Standing Expiration Date:   11/09/2023    Order Specific Question:   Reason for Exam (SYMPTOM  OR DIAGNOSIS REQUIRED)    Answer:   h/o bilateral breast cancer    Order Specific Question:   Preferred imaging location?    Answer:   [facility]   [place] LIMITED ULTRASOUND INCLUDING AXILLA LEFT BREAST     Standing Status:   Future    Standing Expiration Date:   11/09/2023    Order Specific Question:   Reason for Exam (SYMPTOM  OR DIAGNOSIS REQUIRED)    Answer:   h/o bilateral breast cancer monitoring surgical site    Order Specific Question:   Preferred imaging location?    Answer:   [facility]  All questions were answered. The patient knows to call the clinic with any problems, questions or concerns. No barriers to learning were detected.   Santiago Glad, NP-C 11/09/2022

## 2022-11-10 ENCOUNTER — Ambulatory Visit: Payer: Medicare HMO | Admitting: Podiatry

## 2022-11-10 ENCOUNTER — Encounter: Payer: Self-pay | Admitting: Podiatry

## 2022-11-10 DIAGNOSIS — M79674 Pain in right toe(s): Secondary | ICD-10-CM

## 2022-11-10 DIAGNOSIS — M79675 Pain in left toe(s): Secondary | ICD-10-CM | POA: Diagnosis not present

## 2022-11-10 DIAGNOSIS — B351 Tinea unguium: Secondary | ICD-10-CM

## 2022-11-10 DIAGNOSIS — E119 Type 2 diabetes mellitus without complications: Secondary | ICD-10-CM

## 2022-11-10 LAB — CANCER ANTIGEN 27.29: CA 27.29: 19.4 U/mL (ref 0.0–38.6)

## 2022-11-10 NOTE — Progress Notes (Signed)

## 2022-12-03 ENCOUNTER — Other Ambulatory Visit: Payer: Self-pay | Admitting: Nurse Practitioner

## 2022-12-03 DIAGNOSIS — M7989 Other specified soft tissue disorders: Secondary | ICD-10-CM

## 2023-02-11 ENCOUNTER — Encounter: Payer: Self-pay | Admitting: Podiatry

## 2023-02-11 ENCOUNTER — Ambulatory Visit: Payer: Medicare HMO | Admitting: Podiatry

## 2023-02-11 DIAGNOSIS — E119 Type 2 diabetes mellitus without complications: Secondary | ICD-10-CM | POA: Diagnosis not present

## 2023-02-11 DIAGNOSIS — M79675 Pain in left toe(s): Secondary | ICD-10-CM

## 2023-02-11 DIAGNOSIS — B351 Tinea unguium: Secondary | ICD-10-CM

## 2023-02-11 DIAGNOSIS — M79674 Pain in right toe(s): Secondary | ICD-10-CM

## 2023-02-11 NOTE — Progress Notes (Signed)
This patient returns to my office for at risk foot care.  This patient requires this care by a professional since this patient will be at risk due to having diabetes.    This patient is unable to cut nails herself since the patient cannot reach her nails.These nails are painful walking and wearing shoes.  This patient presents for at risk foot care today.  General Appearance  Alert, conversant and in no acute stress.  Vascular  Dorsalis pedis and posterior tibial  pulses are palpable  bilaterally.  Capillary return is within normal limits  bilaterally. Temperature is within normal limits  bilaterally.  Neurologic  Senn-Weinstein monofilament wire test within normal limits  bilaterally. Muscle power within normal limits bilaterally.  Nails Thick disfigured discolored nails with subungual debris  from hallux to fifth toes bilaterally. No evidence of bacterial infection or drainage bilaterally.  Orthopedic  No limitations of motion  feet .  No crepitus or effusions noted.  No bony pathology or digital deformities noted.  Skin  normotropic skin with no porokeratosis noted bilaterally.  No signs of infections or ulcers noted.     Onychomycosis  Pain in right toes  Pain in left toes  Consent was obtained for treatment procedures.   Mechanical debridement of nails 1-5  bilaterally performed with a nail nipper.  Filed with dremel without incident.    Return office visit    3 months                  Told patient to return for periodic foot care and evaluation due to potential at risk complications.   Gregory Mayer DPM   

## 2023-03-01 ENCOUNTER — Ambulatory Visit: Payer: Medicare HMO | Admitting: Diagnostic Neuroimaging

## 2023-03-01 ENCOUNTER — Encounter: Payer: Self-pay | Admitting: Diagnostic Neuroimaging

## 2023-03-01 VITALS — BP 166/96 | HR 88 | Ht 62.0 in | Wt 240.0 lb

## 2023-03-01 DIAGNOSIS — G252 Other specified forms of tremor: Secondary | ICD-10-CM

## 2023-03-01 DIAGNOSIS — G250 Essential tremor: Secondary | ICD-10-CM | POA: Diagnosis not present

## 2023-03-01 DIAGNOSIS — G20C Parkinsonism, unspecified: Secondary | ICD-10-CM | POA: Diagnosis not present

## 2023-03-01 MED ORDER — CARBIDOPA-LEVODOPA 25-100 MG PO TABS: 1.0000 | ORAL_TABLET | Freq: Three times a day (TID) | ORAL | 6 refills | Status: DC

## 2023-03-01 NOTE — Patient Instructions (Signed)
Essential tremor (since ~2015) - continue gabapentin 300mg  twice a day; may consider to increase as tolerated up to 600mg  three times a day   RESTING TREMOR, BRADYKINESIA (unclear onset) - subtle parkinsonism - check DATscan - trial of carb/levo --> start carbidopa / levodopa (25/100) half tab three times a day with meals x 1-2 weeks; then 1 tab three times a day with meals

## 2023-03-01 NOTE — Progress Notes (Signed)
[facility]  PATIENT: Toni Stone DOB: [birth year]  REFERRING CLINICIAN: Iona Hansen, NP HISTORY FROM: patient  REASON FOR VISIT: new consult   HISTORICAL  CHIEF COMPLAINT:  Chief Complaint  Patient presents with   New Patient (Initial Visit)    Rm 7, here alone Pt is here for tremors. Pt states her tremors are in both hands. Pt states she is unable to hold utensils, unable to write and feels her tremors are getting worse.     HISTORY OF PRESENT ILLNESS:   77 year old female here for evaluation of tremor.  Symptoms started around 2012 and was diagnosed with essential tremor.  She was living in [place] at the time and moved to [place] about 2 years ago.  Has tried primidone, propranolol, pregabalin with minimal relief.  Was recently switched to gabapentin which slightly helps.   REVIEW OF SYSTEMS: Full 14 system review of systems performed and negative with exception of: as per HPI.  ALLERGIES: Allergies  Allergen Reactions   Metformin     Other reaction(s): Other (See Comments) Other    HOME MEDICATIONS: Outpatient Medications Prior to Visit  Medication Sig Dispense Refill   Blood Glucose Monitoring Suppl (GLUCOCOM BLOOD GLUCOSE MONITOR) DEVI 1 each by Misc.(Non-Drug; Combo Route) route daily. May substitute for insurance     cyanocobalamin (VITAMIN B12) 100 MCG tablet Take 100 mcg by mouth daily.     febuxostat (ULORIC) 40 MG tablet Take 1 tablet by mouth daily.     glucose blood ([facility] BLOOD GLUCOSE TEST) test strip 1 each by Other route daily.     insulin glargine (LANTUS) 100 UNIT/ML injection Inject into the skin.     Multiple Vitamin (MULTIVITAMIN) tablet Take 1 tablet by mouth daily.     propranolol ER (INDERAL LA) 120 MG 24 hr capsule Take 120 mg by mouth daily.     rosuvastatin (CRESTOR) 10 MG tablet Take 20 mg by mouth at bedtime.     sitaGLIPtin (JANUVIA) 50 MG tablet Take 1 tablet by mouth daily.      triamterene-hydrochlorothiazide (MAXZIDE-25) 37.5-25 MG tablet Take 1 tablet by mouth daily.     gabapentin (NEURONTIN) 300 MG capsule Take 300 mg by mouth 2 (two) times daily.     pregabalin (LYRICA) 75 MG capsule Take by mouth. (Patient not taking: Reported on 11/09/2022)     primidone (MYSOLINE) 250 MG tablet Take by mouth.     No facility-administered medications prior to visit.    PAST MEDICAL HISTORY: Past Medical History:  Diagnosis Date   Anemia    Breast cancer (HCC)    Chronic kidney disease    Diabetes mellitus without complication (HCC)    Family history of bladder cancer    Family history of breast cancer    Hypertension    Neuromuscular disorder (HCC)    Tremor   Personal history of breast cancer    Personal history of chemotherapy    Personal history of radiation therapy     PAST SURGICAL HISTORY: Past Surgical History:  Procedure Laterality Date   ABDOMINAL HYSTERECTOMY     Age 59s, for ectopic pregnancy   BREAST BIOPSY Left 04/2021   BREAST LUMPECTOMY Right 06/23/2004   pT1cN0M0 ER/PR + HER2 - stage IA   BREAST LUMPECTOMY Left 08/23/2018   pT2N0M0 ER/PR/HER2 neg stage IIA    FAMILY HISTORY: Family History  Problem Relation Age of Onset   Cancer Mother        Bile duct  versus gallbladder   Breast cancer Sister        29s   Cancer Sister        Breast and bladder    SOCIAL HISTORY: Social History   Socioeconomic History   Marital status: Single    Spouse name: Not on file   Number of children: 1   Years of education: Not on file   Highest education level: Not on file  Occupational History   Occupation: Retired Charity fundraiser    Comment: Psych  Tobacco Use   Smoking status: Never   Smokeless tobacco: Not on file  Substance and Sexual Activity   Alcohol use: Not Currently   Drug use: Never   Sexual activity: Not Currently  Other Topics Concern   Not on file  Social History Narrative   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   Social Determinants of Health   Financial Resource Strain: Not on file  Food Insecurity: Low Risk  (11/26/2022)   [facility]   Hunger Vital Sign    Worried About Running Out of Food in the Last Year: Never true    Ran Out of Food in the Last Year: Never true  Transportation Needs: Not on file (11/26/2022)  Physical Activity: Not on file  Stress: Not on file  Social Connections: Not on file  Intimate Partner Violence: Not on file     PHYSICAL EXAM  GENERAL EXAM/CONSTITUTIONAL: Vitals:  Vitals:   03/01/23 0847  BP: (!) 166/96  Pulse: 88  Weight: 240 lb (108.9 kg)  Height: 5\' 2"  (1.575 m)   Body mass index is 43.9 kg/m. Wt Readings from Last 3 Encounters:  03/01/23 240 lb (108.9 kg)  11/09/22 239 lb 11.2 oz (108.7 kg)  05/10/22 238 lb 1.6 oz (108 kg)   Patient is in no distress; well developed, nourished and groomed; neck is supple  CARDIOVASCULAR: Examination of carotid arteries is normal; no carotid bruits Regular rate and rhythm, no murmurs Examination of peripheral vascular system by observation and palpation is normal  EYES: Ophthalmoscopic exam of optic discs and posterior segments is normal; no papilledema or hemorrhages No results found.  MUSCULOSKELETAL: Gait, strength, tone, movements noted in Neurologic exam below  NEUROLOGIC: MENTAL STATUS:      No data to display         awake, alert, oriented to person, place and time recent and remote memory intact normal attention and concentration language fluent, comprehension intact, naming intact fund of knowledge appropriate  CRANIAL NERVE:  2nd - no papilledema on fundoscopic exam 2nd, 3rd, 4th, 6th - pupils equal and reactive to light, visual fields full to confrontation, extraocular muscles intact, no nystagmus 5th - facial sensation symmetric 7th - facial strength symmetric 8th - hearing intact 9th - palate elevates symmetrically, uvula midline 11th -  shoulder shrug symmetric 12th - tongue protrusion midline  MOTOR:  INTERMITTENT RESTING TREMOR IN L > R UPPER EXT MILD POSTURAL AND ACTION TREMOR IN BUE NO RIGIDITY MILD BRADYKINESIA IN BUE normal bulk and tone, full strength in the BUE, BLE  SENSORY:  normal and symmetric to light touch, temperature, vibration  COORDINATION:  finger-nose-finger, fine finger movements normal  REFLEXES:  deep tendon reflexes 1+ and symmetric  GAIT/STATION:  ANTALGIC GAIT WITH RIGHT KNEE PAIN; USING 4 POINT CANE; MILD TREMOR IN RIGHT HAND WITH WALKING     DIAGNOSTIC DATA (LABS, IMAGING, TESTING) - I reviewed patient records, labs, notes,  testing and imaging myself where available.  Lab Results  Component Value Date   WBC 5.1 11/09/2022   HGB 11.1 (L) 11/09/2022   HCT 33.8 (L) 11/09/2022   MCV 91.1 11/09/2022   PLT 195 11/09/2022      Component Value Date/Time   NA 141 11/09/2022 0943   K 4.8 11/09/2022 0943   CL 104 11/09/2022 0943   CO2 31 11/09/2022 0943   GLUCOSE 123 (H) 11/09/2022 0943   BUN 36 (H) 11/09/2022 0943   CREATININE 1.19 (H) 11/09/2022 0943   CALCIUM 9.3 11/09/2022 0943   PROT 7.7 11/09/2022 0943   ALBUMIN 3.9 11/09/2022 0943   AST 18 11/09/2022 0943   ALT 16 11/09/2022 0943   ALKPHOS 110 11/09/2022 0943   BILITOT 0.2 (L) 11/09/2022 0943   GFRNONAA 48 (L) 11/09/2022 0943   No results found for: "CHOL", "HDL", "LDLCALC", "LDLDIRECT", "TRIG", "CHOLHDL" No results found for: "HGBA1C" No results found for: "VITAMINB12" No results found for: "TSH"   08/19/10 MRI brain  - Few scattered periventricular white matter changes as  described above suggesting mild chronic small vessel ischemic changes/  demyelinating changes. Please correlate clinically.     ASSESSMENT AND PLAN  77 y.o. year old left handed female here with:   Dx:  1. Essential tremor   2. Resting tremor   3. Parkinsonism, unspecified Parkinsonism type     PLAN:  Essential tremor (since  ~2015) - continue gabapentin 300mg  twice a day; may consider to increase as tolerated up to 600mg  three times a day   RESTING TREMOR, BRADYKINESIA (unclear onset; since last ~5 years) - subtle parkinsonism - check DATscan - trial of carb/levo --> start carbidopa / levodopa (25/100) half tab three times a day with meals x 1-2 weeks; then 1 tab three times a day with meals  Orders Placed This Encounter  Procedures   NM BRAIN DATSCAN TUMOR LOC INFLAM SPECT 1 DAY   Meds ordered this encounter  Medications   carbidopa-levodopa (SINEMET IR) 25-100 MG tablet    Sig: Take 1 tablet by mouth 3 (three) times daily before meals.    Dispense:  90 tablet    Refill:  6   Return in about 4 months (around 07/01/2023).    Suanne Marker, MD 03/01/2023, 10:05 AM Certified in Neurology, Neurophysiology and Neuroimaging  [facility] [address], [address] [place], [place] [address] [phone]

## 2023-03-03 ENCOUNTER — Telehealth: Payer: Self-pay | Admitting: Diagnostic Neuroimaging

## 2023-03-03 NOTE — Telephone Encounter (Signed)
no auth required sent to [facility] nuclear medicine [phone]

## 2023-03-13 IMAGING — MG MM BREAST LOCALIZATION CLIP
4 series · 4 of 12 positions shown · non-contrast
Comparison: Previous exam(s).

CLINICAL DATA: Status post left breast ultrasound-guided biopsy.

EXAM:
3D DIAGNOSTIC LEFT MAMMOGRAM POST ULTRASOUND BIOPSY

[L CC synth-2D]
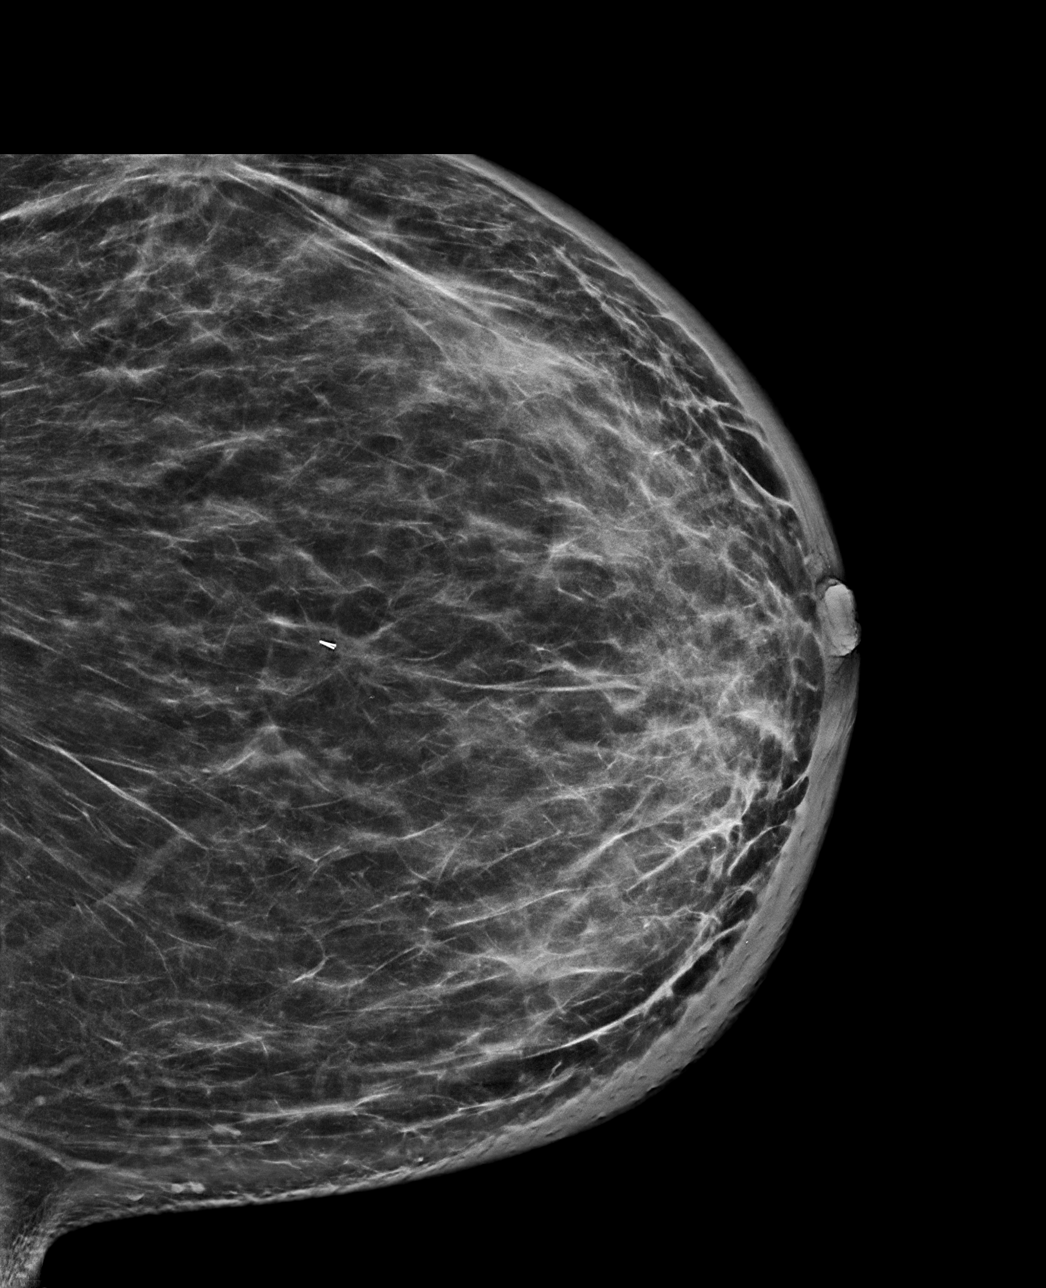

[L ML synth-2D]
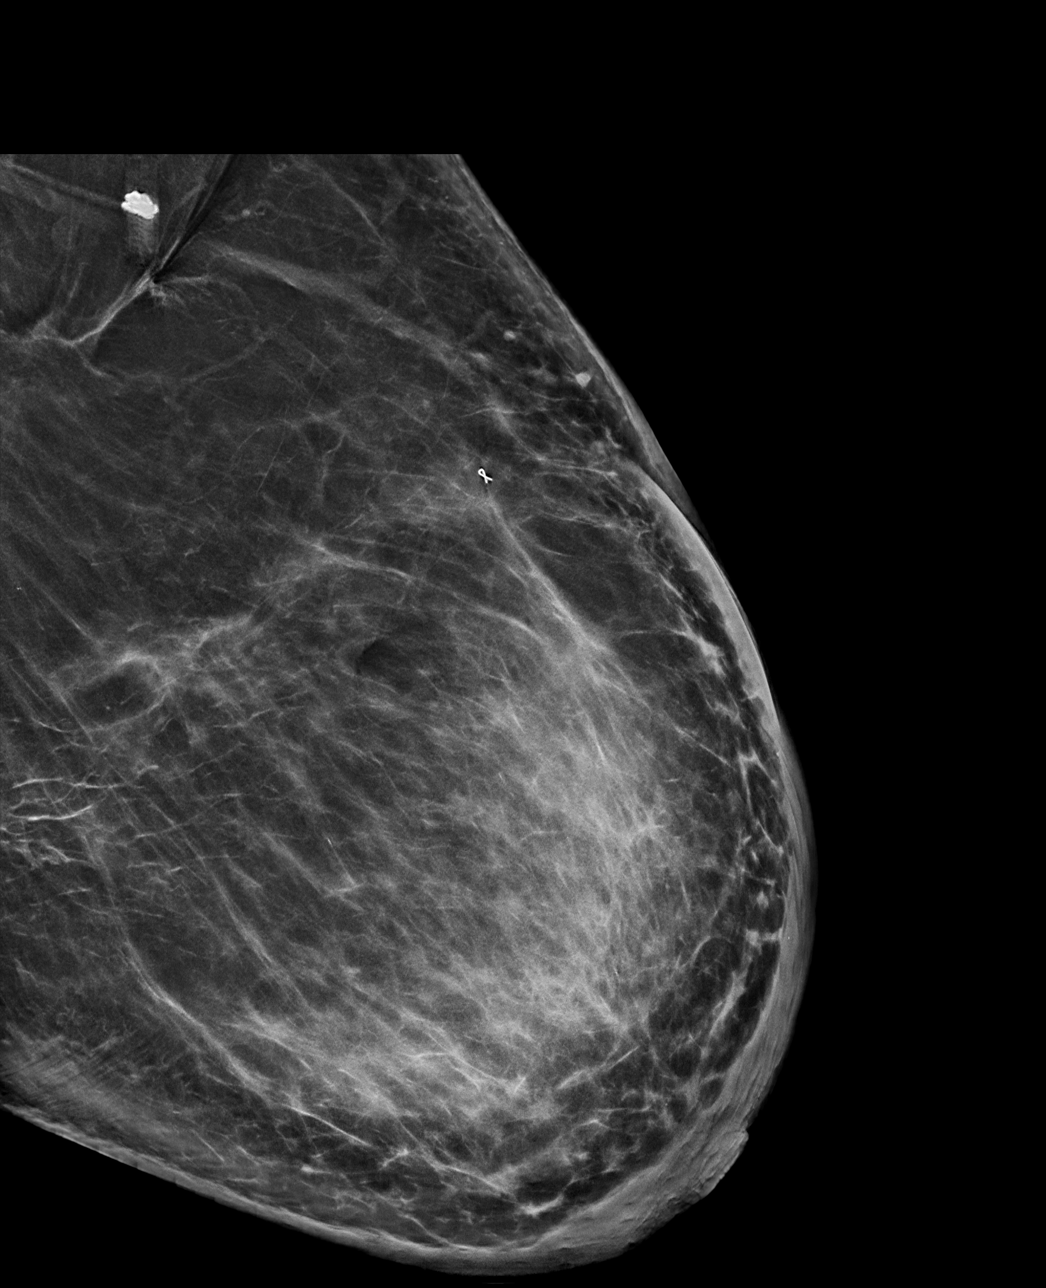

[L ML tomo · tomo slice 56/111.0]
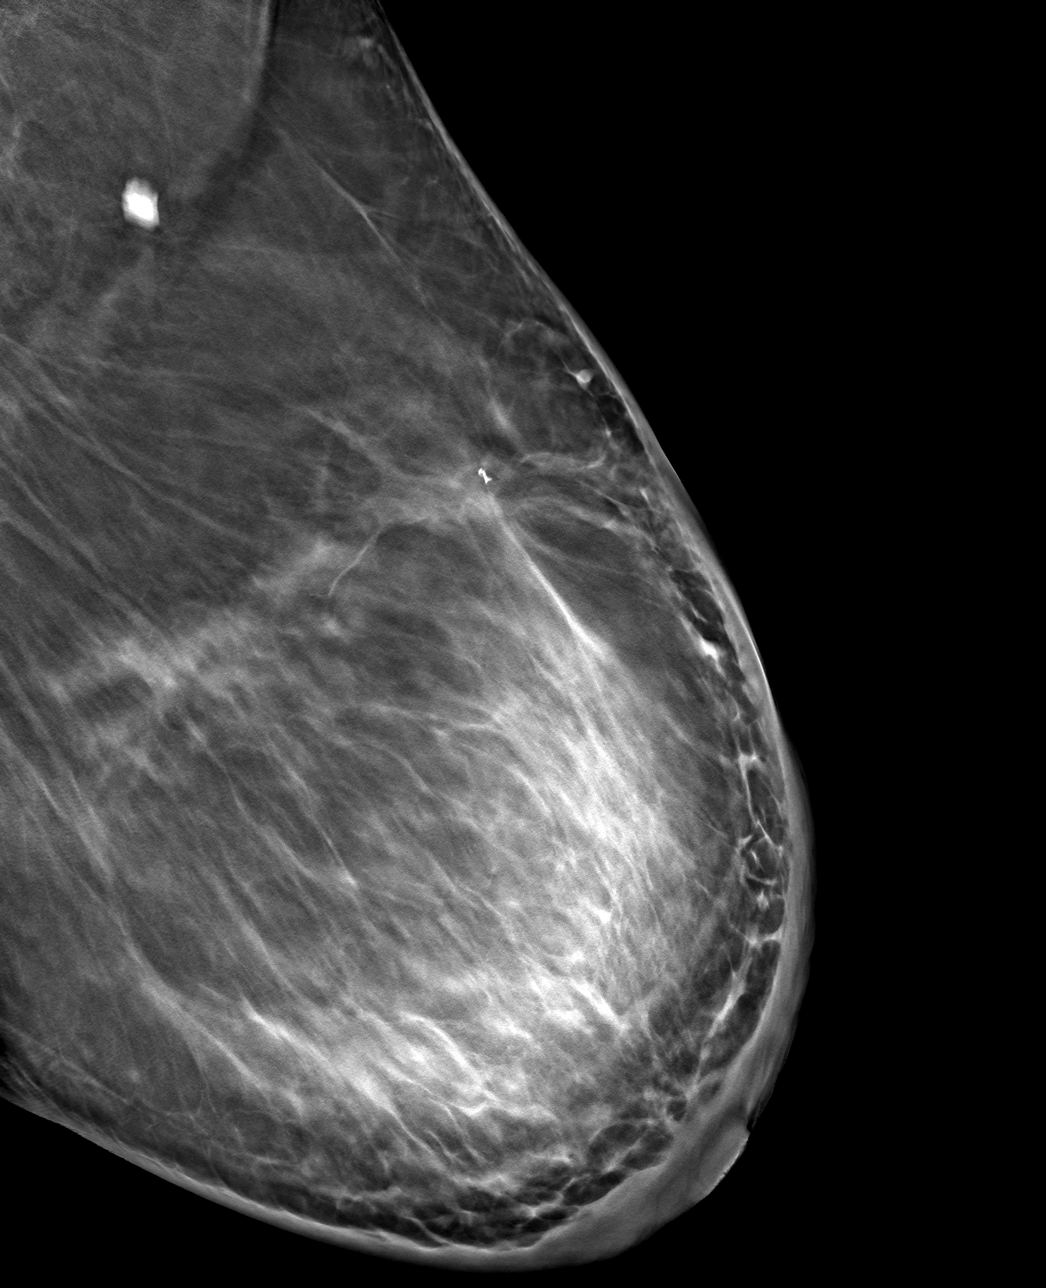

[L CC tomo · tomo slice 47/92.0]
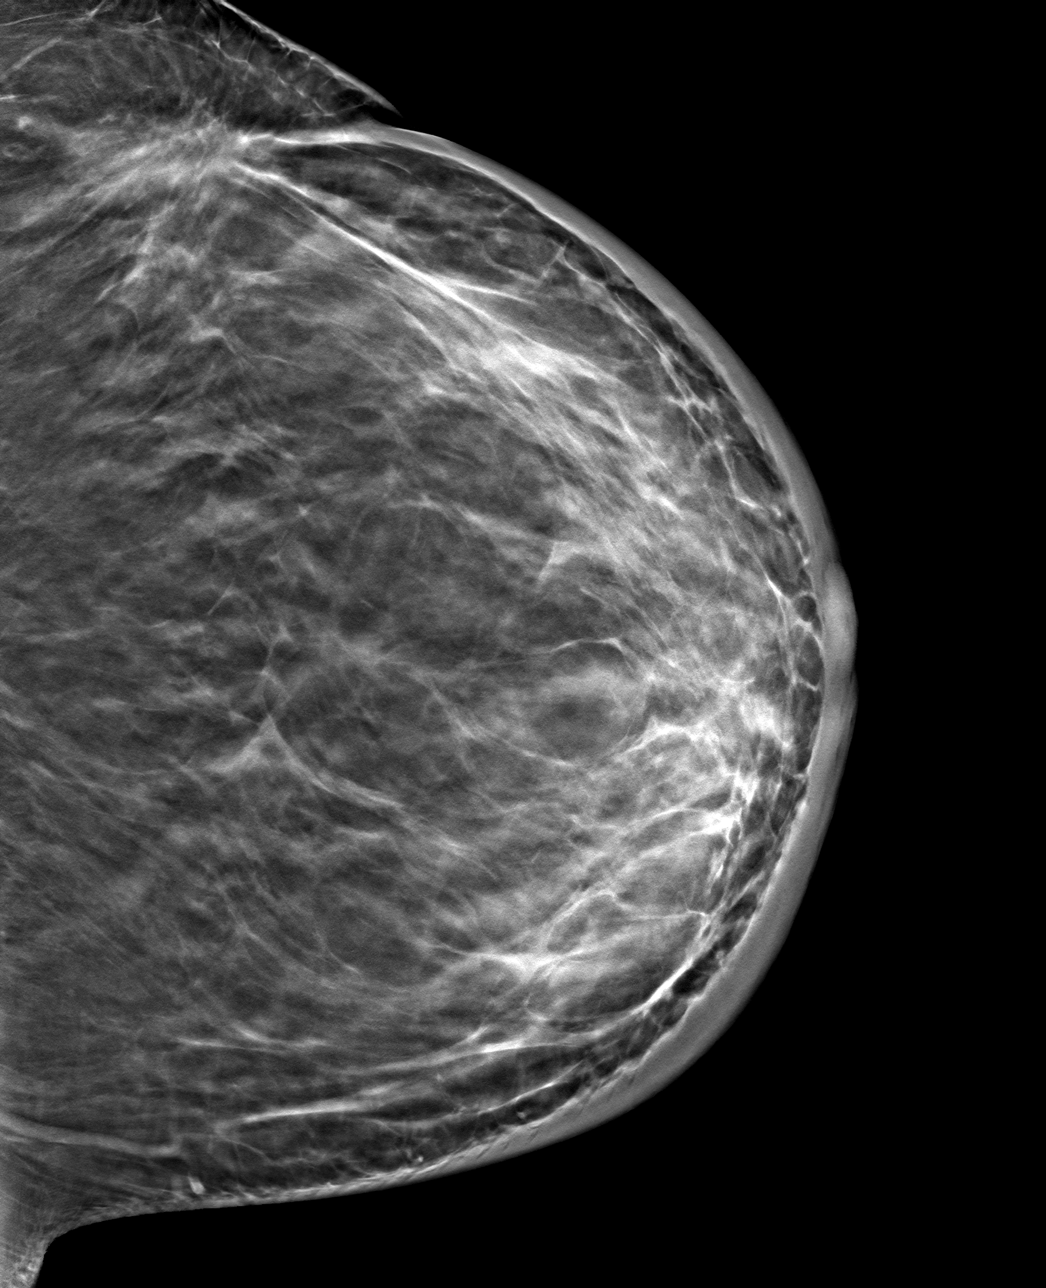

[4 of 12 positions shown; findings below may reference images not displayed]

FINDINGS: 3D Mammographic images were obtained following ultrasound guided
biopsy of the upper left breast. The biopsy marking clip is in
expected position at the site of biopsy.
IMPRESSION: Appropriate positioning of the ribbon shaped biopsy marking clip at
the site of biopsy in the upper left breast at the site of the
mammographically identified distortion.

Final Assessment: Post Procedure Mammograms for Marker Placement

## 2023-04-05 ENCOUNTER — Encounter (HOSPITAL_COMMUNITY)
Admission: RE | Admit: 2023-04-05 | Discharge: 2023-04-05 | Disposition: A | Discharge status: Home / Self Care | Payer: Medicare HMO | Source: Ambulatory Visit | Attending: Diagnostic Neuroimaging | Admitting: Diagnostic Neuroimaging

## 2023-04-05 DIAGNOSIS — G20C Parkinsonism, unspecified: Secondary | ICD-10-CM | POA: Insufficient documentation

## 2023-04-05 DIAGNOSIS — G252 Other specified forms of tremor: Secondary | ICD-10-CM | POA: Diagnosis present

## 2023-04-05 DIAGNOSIS — G250 Essential tremor: Secondary | ICD-10-CM | POA: Insufficient documentation

## 2023-04-05 MED ORDER — POTASSIUM IODIDE (ANTIDOTE) 130 MG PO TABS
ORAL_TABLET | ORAL | Status: AC
  Filled 2023-04-05: qty 1

## 2023-04-05 MED ORDER — IOFLUPANE I 123 185 MBQ/2.5ML IV SOLN
4.5000 | Freq: Once | INTRAVENOUS | Status: AC | PRN
  Administered 2023-04-05: 4.5 via INTRAVENOUS
  Filled 2023-04-05: qty 5

## 2023-05-09 NOTE — Progress Notes (Unsigned)
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 No pathogenic variants identified on the [facility] Multi-Cancer Panel+RNA. The report date is 11/12/2020.  The Multi-Cancer Panel + RNA offered by [facility] includes sequencing and/or deletion duplication testing of the following 84 genes: AIP, ALK, APC, ATM, AXIN2,BAP1,  BARD1, BLM, BMPR1A, BRCA1, BRCA2, BRIP1, CASR, CDC73, CDH1, CDK4, CDKN1B, CDKN1C, CDKN2A (p14ARF), CDKN2A (p16INK4a), CEBPA, CHEK2, CTNNA1, DICER1, DIS3L2, EGFR (c.2369C>T, p.Thr790Met variant only), EPCAM (Deletion/duplication testing only), FH, FLCN, GATA2, GPC3, GREM1 (Promoter region deletion/duplication testing only), HOXB13 (c.251G>A, p.Gly84Glu), HRAS, KIT, MAX, MEN1, MET, MITF (c.952G>A, p.Glu318Lys variant only), MLH1, MSH2, MSH3, MSH6, MUTYH, NBN, NF1, NF2, NTHL1, PALB2, PDGFRA, PHOX2B, PMS2, POLD1, POLE, POT1, PRKAR1A, PTCH1, PTEN, RAD50, RAD51C, RAD51D, RB1, RECQL4, RET, RUNX1, SDHAF2, SDHA (sequence changes only), SDHB, SDHC, SDHD, SMAD4, SMARCA4, SMARCB1, SMARCE1, STK11, SUFU, TERC, TERT, TMEM127, TP53, TSC1, TSC2, VHL, WRN and WT1.   Malignant neoplasm of left breast in female, estrogen receptor negative (HCC)  08/23/2018 Cancer Staging   Staging form: Breast, AJCC 8th Edition - Pathologic stage  from 08/23/2018: Stage IIA (pT2, pN0, cM0, G3, ER-, PR-, HER2-) - Signed by Pollyann Samples, NP on 10/23/2020 Stage prefix: Initial diagnosis Histologic grading system: 3 grade system   10/23/2020 Initial Diagnosis   Malignant neoplasm of left breast in female, estrogen receptor negative (HCC)    Genetic Testing   Negative genetic testing. No pathogenic variants identified on the [facility] Multi-Cancer Panel+RNA. The report date is 11/12/2020.  The Multi-Cancer Panel + RNA offered by [facility] includes sequencing and/or deletion duplication testing of the following 84 genes: AIP, ALK, APC, ATM, AXIN2,BAP1,  BARD1, BLM, BMPR1A, BRCA1, BRCA2, BRIP1, CASR, CDC73, CDH1, CDK4, CDKN1B, CDKN1C, CDKN2A (p14ARF), CDKN2A (p16INK4a), CEBPA, CHEK2, CTNNA1, DICER1, DIS3L2, EGFR (c.2369C>T, p.Thr790Met variant only), EPCAM (Deletion/duplication testing only), FH, FLCN, GATA2, GPC3, GREM1 (Promoter region deletion/duplication testing only), HOXB13 (c.251G>A, p.Gly84Glu), HRAS, KIT, MAX, MEN1, MET, MITF (c.952G>A, p.Glu318Lys variant only), MLH1, MSH2, MSH3, MSH6, MUTYH, NBN, NF1, NF2, NTHL1, PALB2, PDGFRA, PHOX2B, PMS2, POLD1, POLE, POT1, PRKAR1A, PTCH1, PTEN, RAD50, RAD51C, RAD51D, RB1, RECQL4, RET, RUNX1, SDHAF2, SDHA (sequence changes only), SDHB, SDHC, SDHD, SMAD4, SMARCA4, SMARCB1, SMARCE1, STK11, SUFU, TERC, TERT, TMEM127, TP53, TSC1, TSC2, VHL, WRN and WT1.      CURRENT THERAPY: Surveillance  INTERVAL HISTORY Toni Stone returns for follow-up as scheduled, last seen by me 11/09/2022.  Mammo/ultrasound scheduled 06/02/2023.  ROS   Past Medical History:  Diagnosis Date   Anemia    Breast cancer (HCC)    Chronic kidney disease    Diabetes mellitus without complication (HCC)    Family history of bladder cancer    Family history of breast cancer    Hypertension    Neuromuscular disorder (HCC)    Tremor   Personal history of breast cancer    Personal history of chemotherapy  Personal history of radiation  therapy      Past Surgical History:  Procedure Laterality Date   ABDOMINAL HYSTERECTOMY     Age 20s, for ectopic pregnancy   BREAST BIOPSY Left 04/2021   BREAST LUMPECTOMY Right 06/23/2004   pT1cN0M0 ER/PR + HER2 - stage IA   BREAST LUMPECTOMY Left 08/23/2018   pT2N0M0 ER/PR/HER2 neg stage IIA     Outpatient Encounter Medications as of 05/12/2023  Medication Sig   Blood Glucose Monitoring Suppl (GLUCOCOM BLOOD GLUCOSE MONITOR) DEVI 1 each by Misc.(Non-Drug; Combo Route) route daily. May substitute for insurance   carbidopa-levodopa (SINEMET IR) 25-100 MG tablet Take 1 tablet by mouth 3 (three) times daily before meals.   cyanocobalamin (VITAMIN B12) 100 MCG tablet Take 100 mcg by mouth daily.   febuxostat (ULORIC) 40 MG tablet Take 1 tablet by mouth daily.   gabapentin (NEURONTIN) 300 MG capsule Take 300 mg by mouth 2 (two) times daily.   glucose blood ([employer] BLOOD GLUCOSE TEST) test strip 1 each by Other route daily.   insulin glargine (LANTUS) 100 UNIT/ML injection Inject into the skin.   Multiple Vitamin (MULTIVITAMIN) tablet Take 1 tablet by mouth daily.   propranolol ER (INDERAL LA) 120 MG 24 hr capsule Take 120 mg by mouth daily.   rosuvastatin (CRESTOR) 10 MG tablet Take 20 mg by mouth at bedtime.   sitaGLIPtin (JANUVIA) 50 MG tablet Take 1 tablet by mouth daily.   triamterene-hydrochlorothiazide (MAXZIDE-25) 37.5-25 MG tablet Take 1 tablet by mouth daily.   No facility-administered encounter medications on file as of 05/12/2023.     There were no vitals filed for this visit. There is no height or weight on file to calculate BMI.   PHYSICAL EXAM GENERAL:alert, no distress and comfortable SKIN: no rash  EYES: sclera clear NECK: without mass LYMPH:  no palpable cervical or supraclavicular lymphadenopathy  LUNGS: clear with normal breathing effort HEART: regular rate & rhythm, no lower extremity edema ABDOMEN: abdomen soft, non-tender and normal bowel sounds NEURO:  alert & oriented x 3 with fluent speech, no focal motor/sensory deficits Breast exam:  PAC without erythema    CBC    Component Value Date/Time   WBC 5.1 11/09/2022 0943   RBC 3.71 (L) 11/09/2022 0943   HGB 11.1 (L) 11/09/2022 0943   HCT 33.8 (L) 11/09/2022 0943   PLT 195 11/09/2022 0943   MCV 91.1 11/09/2022 0943   MCH 29.9 11/09/2022 0943   MCHC 32.8 11/09/2022 0943   RDW 14.0 11/09/2022 0943   LYMPHSABS 1.2 11/09/2022 0943   MONOABS 0.5 11/09/2022 0943   EOSABS 0.1 11/09/2022 0943   BASOSABS 0.0 11/09/2022 0943     CMP     Component Value Date/Time   NA 141 11/09/2022 0943   K 4.8 11/09/2022 0943   CL 104 11/09/2022 0943   CO2 31 11/09/2022 0943   GLUCOSE 123 (H) 11/09/2022 0943   BUN 36 (H) 11/09/2022 0943   CREATININE 1.19 (H) 11/09/2022 0943   CALCIUM 9.3 11/09/2022 0943   PROT 7.7 11/09/2022 0943   ALBUMIN 3.9 11/09/2022 0943   AST 18 11/09/2022 0943   ALT 16 11/09/2022 0943   ALKPHOS 110 11/09/2022 0943   BILITOT 0.2 (L) 11/09/2022 0943   GFRNONAA 48 (L) 11/09/2022 0943     ASSESSMENT & PLAN:77 yo female with    1.  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  Staging PET scan was negative for distant metastasis.  She completed adjuvant chemo (Taxotere/carboplatin x6 cycles per outside records) and radiation.   -Due to triple negative breast cancer, she would not benefit from antiestrogen therapy.    2.  Malignant neoplasm right breast, stage 1A, pT1cN0M0, ER/PR positive, HER2 negative, grade 2 -Diagnosed 06/23/2004, s/p lumpectomy with axillary lymph node dissection, ?adjuvant chemo, radiation, and 5 years of antiestrogen therapy with anastrozole -Continue surveillance   3.  Genetics -Given patient's history of bilateral breast cancer, and family history of sister with breast and bladder  cancer, and mother with cancer, she qualifies for genetics -testing 10/27/20, negative    4.  Chronic right knee pain -Ambulates with a cane. -Denies any other new/worsening bone or joint pain   5.  DM, CKD, anemia, HTN, HL -Followed by PCP -HG A1c 7.8, BG 172, SCR 1.49, K5.6 on 09/26/2020 -Per outside records from [place], CBC on 06/04/2020 showed Hgb 10.9, HCT 32.4 otherwise normal CBC/differential.  She likely has anemia of chronic disease from CKD and other comorbidities          6.  Health maintenance disease prevention -We reviewed staying up-to-date on age-appropriate cancer screenings and vaccines, she received COVID-vaccine and booster -Colonoscopy in 2014 and EGD in 2015 in [place] to be her last, per patient -Reviewed the importance of healthy diet/weight, exercise as tolerated, avoiding smoking and limiting alcohol -Continue follow-up with PCP    PLAN:  No orders of the defined types were placed in this encounter.     All questions were answered. The patient knows to call the clinic with any problems, questions or concerns. No barriers to learning were detected. I spent *** counseling the patient face to face. The total time spent in the appointment was *** and more than 50% was on counseling, review of test results, and coordination of care.   Toni Glad, NP-C @DATE @

## 2023-05-12 ENCOUNTER — Inpatient Hospital Stay: Payer: Medicare HMO | Attending: Nurse Practitioner | Admitting: Nurse Practitioner

## 2023-05-12 ENCOUNTER — Encounter: Payer: Self-pay | Admitting: Nurse Practitioner

## 2023-05-12 VITALS — BP 158/61 | HR 65 | Temp 97.8°F | Resp 18 | Wt 242.0 lb

## 2023-05-12 DIAGNOSIS — Z8052 Family history of malignant neoplasm of bladder: Secondary | ICD-10-CM | POA: Diagnosis not present

## 2023-05-12 DIAGNOSIS — Z171 Estrogen receptor negative status [ER-]: Secondary | ICD-10-CM | POA: Diagnosis not present

## 2023-05-12 DIAGNOSIS — Z9221 Personal history of antineoplastic chemotherapy: Secondary | ICD-10-CM | POA: Insufficient documentation

## 2023-05-12 DIAGNOSIS — N189 Chronic kidney disease, unspecified: Secondary | ICD-10-CM | POA: Insufficient documentation

## 2023-05-12 DIAGNOSIS — C50911 Malignant neoplasm of unspecified site of right female breast: Secondary | ICD-10-CM | POA: Insufficient documentation

## 2023-05-12 DIAGNOSIS — G8929 Other chronic pain: Secondary | ICD-10-CM | POA: Diagnosis not present

## 2023-05-12 DIAGNOSIS — Z170 Estrogen receptor positive status [ER+]: Secondary | ICD-10-CM | POA: Diagnosis not present

## 2023-05-12 DIAGNOSIS — M25561 Pain in right knee: Secondary | ICD-10-CM | POA: Insufficient documentation

## 2023-05-12 DIAGNOSIS — E1122 Type 2 diabetes mellitus with diabetic chronic kidney disease: Secondary | ICD-10-CM | POA: Insufficient documentation

## 2023-05-12 DIAGNOSIS — I129 Hypertensive chronic kidney disease with stage 1 through stage 4 chronic kidney disease, or unspecified chronic kidney disease: Secondary | ICD-10-CM | POA: Diagnosis not present

## 2023-05-12 DIAGNOSIS — C50912 Malignant neoplasm of unspecified site of left female breast: Secondary | ICD-10-CM | POA: Diagnosis not present

## 2023-05-12 DIAGNOSIS — Z803 Family history of malignant neoplasm of breast: Secondary | ICD-10-CM | POA: Diagnosis not present

## 2023-05-12 DIAGNOSIS — I890 Lymphedema, not elsewhere classified: Secondary | ICD-10-CM | POA: Insufficient documentation

## 2023-05-16 ENCOUNTER — Encounter: Payer: Self-pay | Admitting: Podiatry

## 2023-05-16 ENCOUNTER — Ambulatory Visit: Payer: Medicare HMO | Admitting: Podiatry

## 2023-05-16 DIAGNOSIS — B351 Tinea unguium: Secondary | ICD-10-CM | POA: Diagnosis not present

## 2023-05-16 DIAGNOSIS — M79674 Pain in right toe(s): Secondary | ICD-10-CM | POA: Diagnosis not present

## 2023-05-16 DIAGNOSIS — M79675 Pain in left toe(s): Secondary | ICD-10-CM

## 2023-05-16 DIAGNOSIS — E119 Type 2 diabetes mellitus without complications: Secondary | ICD-10-CM

## 2023-05-16 NOTE — Progress Notes (Signed)

## 2023-06-02 ENCOUNTER — Ambulatory Visit
Admission: RE | Admit: 2023-06-02 | Discharge: 2023-06-02 | Disposition: A | Discharge status: Home / Self Care | Payer: Medicare HMO | Source: Ambulatory Visit | Attending: Nurse Practitioner | Admitting: Nurse Practitioner

## 2023-06-02 DIAGNOSIS — C50912 Malignant neoplasm of unspecified site of left female breast: Secondary | ICD-10-CM

## 2023-06-02 DIAGNOSIS — C50911 Malignant neoplasm of unspecified site of right female breast: Secondary | ICD-10-CM

## 2023-07-04 ENCOUNTER — Encounter: Payer: Self-pay | Admitting: Diagnostic Neuroimaging

## 2023-07-04 ENCOUNTER — Ambulatory Visit: Payer: Medicare HMO | Admitting: Diagnostic Neuroimaging

## 2023-07-04 VITALS — BP 144/78 | HR 93 | Ht 62.0 in | Wt 239.0 lb

## 2023-07-04 DIAGNOSIS — G250 Essential tremor: Secondary | ICD-10-CM

## 2023-07-04 MED ORDER — CARBIDOPA-LEVODOPA 25-100 MG PO TABS: 1.0000 | ORAL_TABLET | Freq: Three times a day (TID) | ORAL | 4 refills | Status: AC

## 2023-07-04 NOTE — Patient Instructions (Signed)
-   continue carbidopa / levodopa (25/100) 1 tab three times a day; then up to 2 tabs three times a day as tolerated (noon, 6pm, midnight)

## 2023-07-04 NOTE — Progress Notes (Signed)
 [facility]  PATIENT: Toni Stone DOB: [birth year]  REFERRING CLINICIAN: Joshua Santana CROME, NP HISTORY FROM: patient  REASON FOR VISIT: follow up   HISTORICAL  CHIEF COMPLAINT:  Chief Complaint  Patient presents with   Follow-up    Pt alone, rm 6. Overall pt states she is stable and has no issues or concerns. She did start the carbidopa  levodopa . She states that she has noticed some improvement on the carbidopa  levodopa . She doesn't wake up til 12 on regular basis and she has been taking it only BID 12 p and again at 9 p. She on average goes to bed around 2/3 am. (She does wake up around 6:30 am but typically goes back to bed and sleeps til 12. The BID dose has helped with the way the tremors felt on inside.    HISTORY OF PRESENT ILLNESS:   UPDATE (07/04/23, VRP): Since last visit, doing doing about the same. Tried carb/levo 1 tab twice a day. Wakes up at noon and goes to bed at 3am. Tremor and gait are stable.   PRIOR HPI (03/01/23): 78 year old female here for evaluation of tremor.  Symptoms started around 2012 and was diagnosed with essential tremor.  She was living in [place]  at the time and moved to [place]  about 2 years ago.  Has tried primidone, propranolol, pregabalin with minimal relief.  Was recently switched to gabapentin which slightly helps.   REVIEW OF SYSTEMS: Full 14 system review of systems performed and negative with exception of: as per HPI.  ALLERGIES: Allergies  Allergen Reactions   Metformin     Other reaction(s): Other (See Comments) Other    HOME MEDICATIONS: Outpatient Medications Prior to Visit  Medication Sig Dispense Refill   Blood Glucose Monitoring Suppl (GLUCOCOM BLOOD GLUCOSE MONITOR) DEVI 1 each by Misc.(Non-Drug; Combo Route) route daily. May substitute for insurance     cyanocobalamin (VITAMIN B12) 100 MCG tablet Take 100 mcg by mouth daily.     febuxostat (ULORIC) 40 MG tablet Take 1 tablet by mouth daily.      gabapentin (NEURONTIN) 300 MG capsule Take 300 mg by mouth 2 (two) times daily.     glucose blood ([facility] BLOOD GLUCOSE TEST) test strip 1 each by Other route daily.     insulin glargine (LANTUS) 100 UNIT/ML injection Inject into the skin.     LANTUS SOLOSTAR 100 UNIT/ML Solostar Pen Inject 40 Units into the skin at bedtime.     Multiple Vitamin (MULTIVITAMIN) tablet Take 1 tablet by mouth daily.     propranolol ER (INDERAL LA) 120 MG 24 hr capsule Take 120 mg by mouth daily.     rosuvastatin (CRESTOR) 20 MG tablet Take 20 mg by mouth at bedtime.     sitaGLIPtin (JANUVIA) 50 MG tablet Take 1 tablet by mouth daily.     triamterene-hydrochlorothiazide (MAXZIDE-25) 37.5-25 MG tablet Take 1 tablet by mouth daily.     carbidopa -levodopa  (SINEMET  IR) 25-100 MG tablet Take 1 tablet by mouth 3 (three) times daily before meals. 90 tablet 6   rosuvastatin (CRESTOR) 10 MG tablet Take 20 mg by mouth at bedtime.     No facility-administered medications prior to visit.    PAST MEDICAL HISTORY: Past Medical History:  Diagnosis Date   Anemia    Breast cancer (HCC)    Chronic kidney disease    Diabetes mellitus without complication (HCC)    Family history of bladder cancer    Family history of breast cancer  Hypertension    Neuromuscular disorder (HCC)    Tremor   Personal history of breast cancer    Personal history of chemotherapy    Personal history of radiation therapy     PAST SURGICAL HISTORY: Past Surgical History:  Procedure Laterality Date   ABDOMINAL HYSTERECTOMY     Age 30s, for ectopic pregnancy   BREAST BIOPSY Left 04/2021   BREAST LUMPECTOMY Right 06/23/2004   pT1cN0M0 ER/PR + HER2 - stage IA   BREAST LUMPECTOMY Left 08/23/2018   pT2N0M0 ER/PR/HER2 neg stage IIA    FAMILY HISTORY: Family History  Problem Relation Age of Onset   Cancer Mother        Bile duct versus gallbladder   Breast cancer Sister        103s   Cancer Sister        Breast and bladder    SOCIAL  HISTORY: Social History   Socioeconomic History   Marital status: Single    Spouse name: Not on file   Number of children: 1   Years of education: Not on file   Highest education level: Not on file  Occupational History   Occupation: Retired [employer]    Comment: Psych  Tobacco Use   Smoking status: Never   Smokeless tobacco: Not on file  Substance and Sexual Activity   Alcohol use: Not Currently   Drug use: Never   Sexual activity: Not Currently  Other Topics Concern   Not on file  Social History Narrative   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 : No  Physical Activity: Not on file  Stress: Not on file  Social Connections: Not on file  Intimate Partner Violence: Not on file     PHYSICAL EXAM  GENERAL EXAM/CONSTITUTIONAL: Vitals:  Vitals:   07/04/23 1449  BP: (!) 144/78  Pulse: 93  Weight: 239 lb (108.4 kg)  Height: 5' 2 (1.575 m)   Body mass index is 43.71 kg/m. Wt Readings from Last 3 Encounters:  07/04/23 239 lb (108.4 kg)  05/12/23 242 lb (109.8 kg)  03/01/23 240 lb (108.9 kg)   Patient is in no distress; well developed, nourished and groomed; neck is supple  CARDIOVASCULAR: Examination of carotid arteries is normal; no carotid bruits Regular rate and rhythm, no murmurs Examination of peripheral vascular system by observation and palpation is normal  EYES: Ophthalmoscopic exam of optic discs and  posterior segments is normal; no papilledema or hemorrhages No results found.  MUSCULOSKELETAL: Gait, strength, tone, movements noted in Neurologic exam below  NEUROLOGIC: MENTAL STATUS:      No data to display         awake, alert, oriented to person, place and time recent and remote memory intact normal attention and concentration language fluent, comprehension intact, naming intact fund of knowledge appropriate  CRANIAL NERVE:  2nd - no papilledema on  fundoscopic exam 2nd, 3rd, 4th, 6th - pupils equal and reactive to light, visual fields full to confrontation, extraocular muscles intact, no nystagmus 5th - facial sensation symmetric 7th - facial strength symmetric 8th - hearing intact 9th - palate elevates symmetrically, uvula midline 11th - shoulder shrug symmetric 12th - tongue protrusion midline  MOTOR:  INTERMITTENT RESTING TREMOR IN L > R UPPER EXT MILD POSTURAL AND ACTION TREMOR IN BUE NO RIGIDITY MILD BRADYKINESIA IN BUE normal bulk and tone, full strength in the BUE, BLE  SENSORY:  normal and symmetric to light touch, temperature, vibration  COORDINATION:  finger-nose-finger, fine finger movements normal  REFLEXES:  deep tendon reflexes 1+ and symmetric  GAIT/STATION:  ANTALGIC GAIT WITH RIGHT KNEE PAIN; USING 4 POINT CANE; MILD TREMOR IN LEFT HAND WITH WALKING     DIAGNOSTIC DATA (LABS, IMAGING, TESTING) - I reviewed patient records, labs, notes, testing and imaging myself where available.  Lab Results  Component Value Date   WBC 5.1 11/09/2022   HGB 11.1 (L) 11/09/2022   HCT 33.8 (L) 11/09/2022   MCV 91.1 11/09/2022   PLT 195 11/09/2022      Component Value Date/Time   NA 141 11/09/2022 0943   K 4.8 11/09/2022 0943   CL 104 11/09/2022 0943   CO2 31 11/09/2022 0943   GLUCOSE 123 (H) 11/09/2022 0943   BUN 36 (H) 11/09/2022 0943   CREATININE 1.19 (H) 11/09/2022 0943   CALCIUM 9.3 11/09/2022 0943   PROT 7.7 11/09/2022 0943   ALBUMIN  3.9 11/09/2022 0943   AST 18 11/09/2022 0943   ALT 16 11/09/2022 0943   ALKPHOS 110 11/09/2022 0943   BILITOT 0.2 (L) 11/09/2022 0943   GFRNONAA 48 (L) 11/09/2022 0943   No results found for: CHOL, HDL, LDLCALC, LDLDIRECT, TRIG, CHOLHDL No results found for: YHAJ8R No results found for: VITAMINB12 No results found for: TSH   08/19/10 MRI brain  - Few scattered periventricular white matter changes as  described above suggesting mild chronic small vessel ischemic changes/  demyelinating changes. Please correlate clinically.   04/05/23 DATscan  - Ioflupane scan within normal limits. No reduced radiotracer activity in basal ganglia to suggest Parkinson's syndrome pathology.      ASSESSMENT AND PLAN  78 y.o. year old left handed female here with:   Dx:  1. Essential tremor      PLAN:  Essential tremor (since ~2015) - continue gabapentin 300mg  twice a day; may consider to increase as tolerated up to 600mg  three times a day   RESTING TREMOR, BRADYKINESIA (unclear onset; since last ~5 years) - subtle parkinsonism; but DATscan  was negative - continue carbidopa  / levodopa  (25/100) 1 tab three times a day with meals; then up to 2 tabs three times a day as tolerated (noon, 6pm, midnight)  Meds ordered this encounter  Medications   carbidopa -levodopa  (SINEMET  IR) 25-100 MG tablet    Sig: Take 1-2 tablets by mouth 3 (three) times daily before meals.    Dispense:  540 tablet    Refill:  4   Return in about 6 months (around 01/01/2024) for MyChart visit (15 min).    EDUARD FABIENE HANLON, MD 07/04/2023, 3:33 PM Certified in Neurology, Neurophysiology and Neuroimaging  [facility] [address] [place], [place] [address] [phone]

## 2023-08-15 ENCOUNTER — Encounter: Payer: Self-pay | Admitting: Podiatry

## 2023-08-15 ENCOUNTER — Ambulatory Visit: Payer: Medicare HMO | Admitting: Podiatry

## 2023-08-15 DIAGNOSIS — B351 Tinea unguium: Secondary | ICD-10-CM

## 2023-08-15 DIAGNOSIS — M79675 Pain in left toe(s): Secondary | ICD-10-CM | POA: Diagnosis not present

## 2023-08-15 DIAGNOSIS — M79674 Pain in right toe(s): Secondary | ICD-10-CM

## 2023-08-15 DIAGNOSIS — E119 Type 2 diabetes mellitus without complications: Secondary | ICD-10-CM

## 2023-08-15 NOTE — Progress Notes (Signed)

## 2023-11-16 ENCOUNTER — Ambulatory Visit: Payer: Medicare HMO | Admitting: Podiatry

## 2024-01-04 ENCOUNTER — Encounter: Payer: Self-pay | Admitting: Diagnostic Neuroimaging

## 2024-01-04 ENCOUNTER — Telehealth (INDEPENDENT_AMBULATORY_CARE_PROVIDER_SITE_OTHER): Admitting: Diagnostic Neuroimaging

## 2024-01-04 DIAGNOSIS — G252 Other specified forms of tremor: Secondary | ICD-10-CM

## 2024-01-04 DIAGNOSIS — G250 Essential tremor: Secondary | ICD-10-CM | POA: Diagnosis not present

## 2024-01-04 NOTE — Progress Notes (Signed)
 [facility]  PATIENT: Toni Stone DOB: 1945/08/15  REFERRING CLINICIAN: Joshua Santana CROME, NP HISTORY FROM: patient  REASON FOR VISIT: follow up   HISTORICAL  CHIEF COMPLAINT:  Chief Complaint  Patient presents with   Tremors    HISTORY OF PRESENT ILLNESS:   UPDATE (01/04/24, VRP): Since last visit, doing about the same. Tolerating meds. Carb/levo twice a day; not able to tolerate three times a day dosing (causes sleepiness).   UPDATE (07/04/23, VRP): Since last visit, doing doing about the same. Tried carb/levo 1 tab twice a day. Wakes up at noon and goes to bed at 3am. Tremor and gait are stable.   PRIOR HPI (03/01/23): 78 year old female here for evaluation of tremor.  Symptoms started around 2012 and was diagnosed with essential tremor.  She was living in [place]  at the time and moved to [place]  about 2 years ago.  Has tried primidone, propranolol, pregabalin with minimal relief.  Was recently switched to gabapentin which slightly helps.   REVIEW OF SYSTEMS: Full 14 system review of systems performed and negative with exception of: as per HPI.  ALLERGIES: Allergies  Allergen Reactions   Metformin     Other reaction(s): Other (See Comments) Other    HOME MEDICATIONS: Outpatient Medications Prior to Visit  Medication Sig Dispense Refill   Blood Glucose Monitoring Suppl (GLUCOCOM BLOOD GLUCOSE MONITOR) DEVI 1 each by Misc.(Non-Drug; Combo Route) route daily. May substitute for insurance     carbidopa -levodopa  (SINEMET  IR) 25-100 MG tablet Take 1-2 tablets by mouth 3 (three) times daily before meals. 540 tablet 4   cyanocobalamin (VITAMIN B12) 100 MCG tablet Take 100 mcg by mouth daily.     febuxostat (ULORIC) 40 MG tablet Take 1 tablet by mouth daily.     gabapentin (NEURONTIN) 300 MG capsule Take 300 mg by mouth 2 (two) times daily.     glucose blood ([employer] BLOOD GLUCOSE TEST) test strip 1 each by Other route daily.     insulin glargine  (LANTUS) 100 UNIT/ML injection Inject into the skin.     LANTUS SOLOSTAR 100 UNIT/ML Solostar Pen Inject 40 Units into the skin at bedtime.     Multiple Vitamin (MULTIVITAMIN) tablet Take 1 tablet by mouth daily.     propranolol ER (INDERAL LA) 120 MG 24 hr capsule Take 120 mg by mouth daily.     rosuvastatin (CRESTOR) 20 MG tablet Take 20 mg by mouth at bedtime.     sitaGLIPtin (JANUVIA) 50 MG tablet Take 1 tablet by mouth daily.     triamterene-hydrochlorothiazide (MAXZIDE-25) 37.5-25 MG tablet Take 1 tablet by mouth daily.     No facility-administered medications prior to visit.    PAST MEDICAL HISTORY: Past Medical History:  Diagnosis Date   Anemia    Breast cancer (HCC)    Chronic kidney disease    Diabetes mellitus without complication (HCC)    Family history of bladder cancer    Family history of breast cancer    Hypertension    Neuromuscular disorder (HCC)    Tremor   Personal history of breast cancer    Personal history of chemotherapy    Personal history of radiation therapy     PAST SURGICAL HISTORY: Past Surgical History:  Procedure Laterality Date   ABDOMINAL HYSTERECTOMY     Age 39s, for ectopic pregnancy   BREAST BIOPSY Left 04/2021   BREAST LUMPECTOMY Right 06/23/2004   pT1cN0M0 ER/PR + HER2 - stage IA   BREAST LUMPECTOMY  Left 08/23/2018   pT2N0M0 ER/PR/HER2 neg stage IIA    FAMILY HISTORY: Family History  Problem Relation Age of Onset   Cancer Mother        Bile duct versus gallbladder   Breast cancer Sister        64s   Cancer Sister        Breast and bladder    SOCIAL HISTORY: Social History   Socioeconomic History   Marital status: Single    Spouse name: Not on file   Number of children: 1   Years of education: Not on file   Highest education level: Not on file  Occupational History   Occupation: Retired Charity fundraiser    Comment: Psych  Tobacco Use   Smoking status: Never   Smokeless tobacco: Not on file  Substance and Sexual Activity    Alcohol use: Not Currently   Drug use: Never   Sexual activity: Not Currently  Other Topics Concern   Not on file  Social History Narrative   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 : Patient declined to answer  Transportation Needs: No Transportation Needs (08/07/2023)   Received from [facility]    In the past 12 months, has lack of reliable transportation kept you from medical appointments, meetings, work or from getting things needed for daily living? : No  Physical Activity: Not on file  Stress: Not on file  Social Connections: Not on file  Intimate Partner Violence: Not on file     PHYSICAL EXAM  Video visit: exam from 07/04/23:  NEUROLOGIC: MENTAL STATUS:      No data to display         awake, alert, oriented to person, place and time recent and remote memory intact normal attention and concentration language fluent, comprehension intact, naming intact fund of knowledge appropriate  CRANIAL NERVE:  2nd - no papilledema on fundoscopic exam 2nd, 3rd, 4th, 6th - pupils equal and reactive to light, visual fields full to confrontation, extraocular muscles intact, no nystagmus 5th - facial sensation symmetric 7th - facial strength symmetric 8th - hearing intact 9th - palate elevates symmetrically, uvula midline 11th - shoulder shrug symmetric 12th - tongue protrusion midline  MOTOR:  INTERMITTENT RESTING TREMOR IN L > R UPPER EXT MILD POSTURAL AND ACTION TREMOR IN BUE NO RIGIDITY MILD BRADYKINESIA IN BUE normal bulk and tone,  full strength in the BUE, BLE  SENSORY:  normal and symmetric to light touch, temperature, vibration  COORDINATION:  finger-nose-finger, fine finger movements normal  REFLEXES:  deep tendon reflexes 1+ and symmetric  GAIT/STATION:  ANTALGIC GAIT WITH RIGHT KNEE PAIN; USING 4 POINT CANE; MILD TREMOR IN LEFT HAND WITH WALKING     DIAGNOSTIC DATA (LABS, IMAGING, TESTING) - I reviewed patient records, labs, notes, testing and imaging myself where available.  Lab Results  Component Value Date   WBC 5.1 11/09/2022   HGB 11.1 (L) 11/09/2022   HCT 33.8 (L) 11/09/2022   MCV 91.1 11/09/2022  PLT 195 11/09/2022      Component Value Date/Time   NA 141 11/09/2022 0943   K 4.8 11/09/2022 0943   CL 104 11/09/2022 0943   CO2 31 11/09/2022 0943   GLUCOSE 123 (H) 11/09/2022 0943   BUN 36 (H) 11/09/2022 0943   CREATININE 1.19 (H) 11/09/2022 0943   CALCIUM 9.3 11/09/2022 0943   PROT 7.7 11/09/2022 0943   ALBUMIN 3.9 11/09/2022 0943   AST 18 11/09/2022 0943   ALT 16 11/09/2022 0943   ALKPHOS 110 11/09/2022 0943   BILITOT 0.2 (L) 11/09/2022 0943   GFRNONAA 48 (L) 11/09/2022 0943   No results found for: CHOL, HDL, LDLCALC, LDLDIRECT, TRIG, CHOLHDL No results found for: YHAJ8R No results found for: VITAMINB12 No results found for: TSH   08/19/10 MRI brain  - Few scattered periventricular white matter changes as  described above suggesting mild chronic small vessel ischemic changes/  demyelinating changes. Please correlate clinically.   04/05/23 DATscan  - Ioflupane scan within normal limits. No reduced radiotracer activity in basal ganglia to suggest Parkinson's syndrome pathology.      ASSESSMENT AND PLAN  78 y.o. year old left handed female here with:   Meds tried: primidone, propranolol, pregabalin, gabapentin   Dx:  1. Essential tremor   2. Resting tremor     PLAN:  Essential tremor (since ~2015) - continue gabapentin 300mg  twice a  day  RESTING TREMOR, BRADYKINESIA (unclear onset; since last ~5 years) - subtle parkinsonism; but DATscan  was negative - may try weaning off carbidopa  / levodopa  (25/100) --> 1 tab daily x 1 week then stop  No orders of the defined types were placed in this encounter.  Return in about 7 months (around 08/06/2024).  Virtual Visit via Video Note  I connected with Toni Stone on 01/04/2024 at 10:30 AM EDT by a video enabled telemedicine application and verified that I am speaking with the correct person using two identifiers.   I discussed the limitations of evaluation and management by telemedicine and the availability of in person appointments. The patient expressed understanding and agreed to proceed.  Patient is at home and I am at the office.   I spent 15 minutes of face-to-face and non-face-to-face time with patient.  This included previsit chart review, lab review, study review, order entry, electronic health record documentation, patient education.      EDUARD FABIENE HANLON, MD 01/04/2024, 10:52 AM Certified in Neurology, Neurophysiology and Neuroimaging  [facility] [address], [address] [place], [place] [address] [phone]

## 2024-01-09 ENCOUNTER — Telehealth: Payer: Medicare HMO | Admitting: Diagnostic Neuroimaging

## 2024-01-16 ENCOUNTER — Telehealth: Payer: Medicare HMO | Admitting: Diagnostic Neuroimaging

## 2024-02-16 ENCOUNTER — Encounter: Payer: Self-pay | Admitting: Podiatry

## 2024-02-16 ENCOUNTER — Ambulatory Visit: Admitting: Podiatry

## 2024-02-16 DIAGNOSIS — E119 Type 2 diabetes mellitus without complications: Secondary | ICD-10-CM | POA: Diagnosis not present

## 2024-02-16 DIAGNOSIS — M79675 Pain in left toe(s): Secondary | ICD-10-CM

## 2024-02-16 DIAGNOSIS — M79674 Pain in right toe(s): Secondary | ICD-10-CM

## 2024-02-16 DIAGNOSIS — B351 Tinea unguium: Secondary | ICD-10-CM | POA: Diagnosis not present

## 2024-02-16 NOTE — Progress Notes (Signed)

## 2024-05-21 ENCOUNTER — Encounter: Payer: Self-pay | Admitting: Podiatry

## 2024-05-21 ENCOUNTER — Other Ambulatory Visit: Payer: Self-pay | Admitting: Nurse Practitioner

## 2024-05-21 ENCOUNTER — Ambulatory Visit: Admitting: Podiatry

## 2024-05-21 DIAGNOSIS — M79675 Pain in left toe(s): Secondary | ICD-10-CM

## 2024-05-21 DIAGNOSIS — B351 Tinea unguium: Secondary | ICD-10-CM

## 2024-05-21 DIAGNOSIS — M79674 Pain in right toe(s): Secondary | ICD-10-CM

## 2024-05-21 DIAGNOSIS — E119 Type 2 diabetes mellitus without complications: Secondary | ICD-10-CM | POA: Diagnosis not present

## 2024-05-21 DIAGNOSIS — Z1231 Encounter for screening mammogram for malignant neoplasm of breast: Secondary | ICD-10-CM

## 2024-05-21 NOTE — Progress Notes (Signed)
 This patient returns to my office for at risk foot care.  This patient requires this care by a professional since this patient will be at risk due to having diabetes.  This patient is unable to cut nails herself since the patient cannot reach her nails.These nails are painful walking and wearing shoes.  This patient presents for at risk foot care today.  General Appearance  Alert, conversant and in no acute stress.  Vascular  Dorsalis pedis and posterior tibial  pulses are palpable  bilaterally.  Capillary return is within normal limits  bilaterally. Temperature is within normal limits  bilaterally.  Neurologic  Senn-Weinstein monofilament wire test within normal limits  bilaterally. Muscle power within normal limits bilaterally.  Nails Thick disfigured discolored nails with subungual debris  from hallux to fifth toes bilaterally. No evidence of bacterial infection or drainage bilaterally.  Orthopedic  No limitations of motion  feet .  No crepitus or effusions noted.  No bony pathology or digital deformities noted.  Skin  normotropic skin with no porokeratosis noted bilaterally.  No signs of infections or ulcers noted.     Onychomycosis  Pain in right toes  Pain in left toes  Consent was obtained for treatment procedures.   Mechanical debridement of nails 1-5  bilaterally performed with a nail nipper.  Filed with dremel without incident. Patient has blackened area on plantar  aspect left foot plantarly.  Told to keep a check on this lesion.   Return office visit      3 months                Told patient to return for periodic foot care and evaluation due to potential at risk complications.   Cordella Bold DPM

## 2024-05-22 ENCOUNTER — Ambulatory Visit

## 2024-06-15 ENCOUNTER — Ambulatory Visit

## 2024-07-11 ENCOUNTER — Ambulatory Visit
Admission: RE | Admit: 2024-07-11 | Discharge: 2024-07-11 | Disposition: A | Source: Ambulatory Visit | Attending: Nurse Practitioner

## 2024-07-11 DIAGNOSIS — Z1231 Encounter for screening mammogram for malignant neoplasm of breast: Secondary | ICD-10-CM

## 2024-08-20 ENCOUNTER — Ambulatory Visit: Admitting: Podiatry
